# Patient Record
Sex: Male | Born: 1935 | Race: Black or African American | Hispanic: No | Marital: Married | Smoking: Never smoker
Health system: Southern US, Community
[De-identification: ages and names within clinical notes are randomized; demographics above are authoritative.]

## PROBLEM LIST (undated history)

## (undated) DIAGNOSIS — I10 Essential (primary) hypertension: Secondary | ICD-10-CM

## (undated) DIAGNOSIS — E119 Type 2 diabetes mellitus without complications: Secondary | ICD-10-CM

## (undated) DIAGNOSIS — I442 Atrioventricular block, complete: Secondary | ICD-10-CM

## (undated) DIAGNOSIS — N289 Disorder of kidney and ureter, unspecified: Secondary | ICD-10-CM

## (undated) DIAGNOSIS — E7800 Pure hypercholesterolemia, unspecified: Secondary | ICD-10-CM

## (undated) DIAGNOSIS — R413 Other amnesia: Secondary | ICD-10-CM

## (undated) DIAGNOSIS — J45909 Unspecified asthma, uncomplicated: Secondary | ICD-10-CM

## (undated) HISTORY — PX: OTHER SURGICAL HISTORY: SHX169

## (undated) HISTORY — DX: Atrioventricular block, complete: I44.2

---

## 2007-08-05 ENCOUNTER — Ambulatory Visit: Payer: Self-pay | Admitting: Gastroenterology

## 2008-03-08 ENCOUNTER — Emergency Department: Payer: Self-pay | Admitting: Internal Medicine

## 2011-12-17 ENCOUNTER — Inpatient Hospital Stay: Payer: Self-pay | Admitting: Internal Medicine

## 2011-12-21 ENCOUNTER — Emergency Department: Payer: Self-pay | Admitting: Emergency Medicine

## 2011-12-27 ENCOUNTER — Ambulatory Visit: Payer: Self-pay | Admitting: Internal Medicine

## 2012-01-06 ENCOUNTER — Inpatient Hospital Stay: Payer: Self-pay | Admitting: Internal Medicine

## 2014-11-03 ENCOUNTER — Emergency Department: Payer: Self-pay | Admitting: Emergency Medicine

## 2014-11-14 ENCOUNTER — Emergency Department: Payer: Self-pay | Admitting: Emergency Medicine

## 2015-02-24 ENCOUNTER — Other Ambulatory Visit (HOSPITAL_COMMUNITY): Payer: Self-pay

## 2015-04-23 NOTE — H&P (Signed)
PATIENT NAME:  Fernando [name], Fernando [name] MR#:  [id number] DATE OF BIRTH:  [birth year]  DATE OF ADMISSION:  01/06/2012  HISTORY OF PRESENT ILLNESS: Fernando [name] is a 80 year old black gentleman who came in with his wife on the day of admission for his annual wellness exam and follow-up on his chronic medical problems. When his vital signs were taken, it was noted that he had a marked bradycardia. Rhythm strip was obtained which showed a marked bradycardia with a rate of 36. There appears to be complete heart block.   PAST MEDICAL HISTORY:  1. Hypertension.  2. Hyperlipidemia.  3. Reactive airway disease.  4. Nephrolithiasis.  5. Type 2 diabetes.  6. Chronic renal insufficiency.   ALLERGIES: No known drug allergies.   ADMISSION MEDICATIONS: 1. Amlodipine 10 mg daily.  2. Ventolin 2 puffs every four hours p.r.n.  3. Advair inhaler 100/500 one puff twice a day. 4. Glipizide XL 10 mg daily.  5. Simvastatin 40 mg at bedtime.  6. Ecotrin 325 mg daily. 7. Metoprolol 100 mg twice a day. 8. Metformin 1000 mg twice a day.   SOCIAL/FAMILY HISTORY: The patient's family history and social history are basically unchanged since his most recent admission.   REVIEW OF SYSTEMS: Review of systems was essentially unremarkable with the exception of the present illness. His wife however did note that she had been checking his blood sugars several times a day when he complained of not feeling well and on one occasion got a blood sugar of 60. At that time she had held his metformin.   PHYSICAL EXAMINATION:   VITAL SIGNS: Weight 199 pounds, blood pressure 142/76, and pulse 36.   GENERAL: This is an elderly black gentleman who is in no acute distress despite his low heart rate.   SKIN: Normal in color and texture. There is no lymphadenopathy.   HEENT: Examination of head, ears, eyes, nose and throat was felt to be grossly within normal limits.   NECK: The neck was supple.   LUNGS: Clear to auscultation and  percussion.   HEART: Regular bradycardia without murmur or gallop.   ABDOMEN: Soft and nontender. There were no masses or organomegaly. Bowel sounds were normal.   GENITAL/RECTAL: Deferred.   EXTREMITIES: No edema or deformity.   NEUROLOGIC: Examination was physiological.   IMPRESSION: Complete heart block, possibly medication related.   PLAN: The patient is being admitted to the regular medical floor where he will be placed on telemetry. Because of his bradycardia his metoprolol will be suspended. Because of the patient's history of possible hypoglycemia, his metformin will be held. He will be continued on glipizide.       He will be placed on a sliding scale of insulin. If the patient's heart rate does not improve with holding the beta blocker, cardiology consultation will be requested to consider permanent pacemaker placement.  ____________________________ Letta PateJohn B. Danne HarborWalker III, MD [email or web address] D: 01/06/2012 12:01:16 ET T: 01/06/2012 12:20:04 ET JOB#: [id number]  cc: Jonny RuizJohn B. Danne HarborWalker III, MD, <Dictator> Elmo PuttJOHN B WALKER III MD ELECTRONICALLY SIGNED 01/08/2012 14:55

## 2015-04-30 ENCOUNTER — Emergency Department
Admission: EM | Admit: 2015-04-30 | Discharge: 2015-04-30 | Disposition: A | Discharge status: Home / Self Care | Payer: Commercial Managed Care - HMO | Attending: Emergency Medicine | Admitting: Emergency Medicine

## 2015-04-30 ENCOUNTER — Encounter: Payer: Self-pay | Admitting: Emergency Medicine

## 2015-04-30 ENCOUNTER — Other Ambulatory Visit: Payer: Self-pay

## 2015-04-30 ENCOUNTER — Emergency Department: Payer: Commercial Managed Care - HMO

## 2015-04-30 DIAGNOSIS — I129 Hypertensive chronic kidney disease with stage 1 through stage 4 chronic kidney disease, or unspecified chronic kidney disease: Secondary | ICD-10-CM | POA: Insufficient documentation

## 2015-04-30 DIAGNOSIS — E119 Type 2 diabetes mellitus without complications: Secondary | ICD-10-CM | POA: Insufficient documentation

## 2015-04-30 DIAGNOSIS — J069 Acute upper respiratory infection, unspecified: Secondary | ICD-10-CM

## 2015-04-30 DIAGNOSIS — N189 Chronic kidney disease, unspecified: Secondary | ICD-10-CM | POA: Diagnosis not present

## 2015-04-30 DIAGNOSIS — J4521 Mild intermittent asthma with (acute) exacerbation: Secondary | ICD-10-CM | POA: Diagnosis not present

## 2015-04-30 DIAGNOSIS — R062 Wheezing: Secondary | ICD-10-CM | POA: Diagnosis present

## 2015-04-30 HISTORY — DX: Type 2 diabetes mellitus without complications: E11.9

## 2015-04-30 HISTORY — DX: Unspecified asthma, uncomplicated: J45.909

## 2015-04-30 HISTORY — DX: Pure hypercholesterolemia, unspecified: E78.00

## 2015-04-30 HISTORY — DX: Essential (primary) hypertension: I10

## 2015-04-30 LAB — BASIC METABOLIC PANEL
ANION GAP: 10 (ref 5–15)
BUN: 19 mg/dL (ref 6–20)
CHLORIDE: 99 mmol/L — AB (ref 101–111)
CO2: 25 mmol/L (ref 22–32)
Calcium: 9.2 mg/dL (ref 8.9–10.3)
Creatinine, Ser: 1.31 mg/dL — ABNORMAL HIGH (ref 0.61–1.24)
GFR calc non Af Amer: 51 mL/min — ABNORMAL LOW (ref >60)
GFR, EST AFRICAN AMERICAN: 59 mL/min — AB (ref >60)
Glucose, Bld: 169 mg/dL — ABNORMAL HIGH (ref 65–99)
Potassium: 3.6 mmol/L (ref 3.5–5.1)
SODIUM: 134 mmol/L — AB (ref 135–145)

## 2015-04-30 LAB — CBC WITH DIFFERENTIAL/PLATELET
Basophils Absolute: 0 10*3/uL (ref 0–0.1)
Basophils Relative: 1 %
EOS ABS: 0.3 10*3/uL (ref 0–0.7)
HEMATOCRIT: 42.5 % (ref 40.0–52.0)
HEMOGLOBIN: 14.5 g/dL (ref 13.0–18.0)
LYMPHS ABS: 1.3 10*3/uL (ref 1.0–3.6)
Lymphocytes Relative: 19 %
MCH: 29.7 pg (ref 26.0–34.0)
MCHC: 34 g/dL (ref 32.0–36.0)
MCV: 87.4 fL (ref 80.0–100.0)
MONO ABS: 1 10*3/uL (ref 0.2–1.0)
Monocytes Relative: 15 %
Neutro Abs: 4.2 10*3/uL (ref 1.4–6.5)
Neutrophils Relative %: 61 %
Platelets: 175 10*3/uL (ref 150–440)
RBC: 4.87 MIL/uL (ref 4.40–5.90)
RDW: 13.4 % (ref 11.5–14.5)
WBC: 6.8 10*3/uL (ref 3.8–10.6)

## 2015-04-30 LAB — TROPONIN I: Troponin I: 0.03 ng/mL (ref <0.031)

## 2015-04-30 MED ORDER — PREDNISONE 20 MG PO TABS
40.0000 mg | ORAL_TABLET | Freq: Once | ORAL | Status: AC
  Administered 2015-04-30: 40 mg via ORAL

## 2015-04-30 MED ORDER — PREDNISONE 20 MG PO TABS
ORAL_TABLET | ORAL | Status: AC
  Filled 2015-04-30: qty 2

## 2015-04-30 MED ORDER — IPRATROPIUM-ALBUTEROL 0.5-2.5 (3) MG/3ML IN SOLN
RESPIRATORY_TRACT | Status: AC
  Filled 2015-04-30: qty 3

## 2015-04-30 MED ORDER — PREDNISONE 10 MG PO TABS: ORAL_TABLET | ORAL | Status: DC

## 2015-04-30 MED ORDER — IPRATROPIUM-ALBUTEROL 0.5-2.5 (3) MG/3ML IN SOLN
3.0000 mL | Freq: Once | RESPIRATORY_TRACT | Status: AC
  Administered 2015-04-30: 3 mL via RESPIRATORY_TRACT

## 2015-04-30 MED ORDER — KETOROLAC TROMETHAMINE 60 MG/2ML IM SOLN: 60.0000 mg | Freq: Once | INTRAMUSCULAR | Status: DC

## 2015-04-30 NOTE — Discharge Instructions (Signed)

## 2015-04-30 NOTE — ED Notes (Signed)
Patient c/o cough, congestion and wheezing x3-4 days. Worse at night. Hx of asthma. Able to ambulate and speak in complete sentences without any obvious distress

## 2015-04-30 NOTE — ED Provider Notes (Signed)
[facility] Emergency Department Provider Note    ____________________________________________  Time seen:10:00   I have  reviewed the triage vital signs and the nursing notes.   HISTORY  Chief Complaint Cough    HPI Fernando [name] is a 80 y.o. male who presents to the emergency room today with complaint of wheezing last night. He states that he developed a cold for the last 3 or 4 days prior to the event last night. He has been using his albuterol inhaler which has helped. He is also used his inhaler this morning and  states that he is feeling much better at this time.  His daughter is here with him today and is concerned about this as her mother was diagnosed with bronchitis this week.     Past Medical History  Diagnosis Date  . Asthma   . Diabetes mellitus without complication   . Hypertension   . Hypercholesteremia    history of bradycardia, with previous admission to [facility]  There are no active problems to display for this patient.   Past Surgical History  Procedure Laterality Date  . Left arm surgery      No current outpatient prescriptions on file.  Allergies Shellfish allergy  No family history on file.  Social History History  Substance Use Topics  . Smoking status: Never Smoker   . Smokeless tobacco: Not on file  . Alcohol Use: No    Review of Systems  Constitutional: Negative for fever. Eyes: Negative for visual changes. ENT: Positive for sore throat. Cardiovascular: Negative for chest pain.  Respiratory: Mild shortness of breath due to his asthma  Gastrointestinal: Negative for abdominal pain, vomiting and diarrhea. Neurological: Negative for headaches, focal weakness or numbness.  10-point ROS otherwise negative.  ____________________________________________   PHYSICAL EXAM:  VITAL SIGNS: ED Triage Vitals  Enc Vitals Group     BP 04/30/15 0941 188/96 mmHg     Pulse Rate 04/30/15 0941 52     Resp 04/30/15  0941 20     Temp 04/30/15 0941 99.8 F (37.7 C)     Temp Source 04/30/15 0941 Oral     SpO2 04/30/15 0941 95 %     Weight 04/30/15 0941 200 lb (90.719 kg)     Height 04/30/15 0941  (1.753 m)     Head Cir --      Peak Flow --      Pain Score --      Pain Loc --      Pain Edu? --      Excl. in GC? --      Constitutional: Alert and oriented. Well appearing and in no distress. Patient is talking in complete sentences without any distress Eyes: Conjunctivae are normal. PERRL. Normal extraocular movements. ENT   Head: Normocephalic and atraumatic.   Nose: Minimal congestion   Mouth/Throat: Mucous membranes are moist.   Neck: No stridor. Hematological/Lymphatic/Immunilogical: No cervical lymphadenopathy. Cardiovascular: Low normal rate, regular rhythm. Normal and symmetric distal pulses are present in all extremities. No murmurs, rubs, or gallops. Respiratory: Normal respiratory effort without tachypnea nor retractions. Breath sounds are are without wheezing, with fair air exchange bilaterally  Gastrointestinal: Soft and nontender. No distention. No abdominal bruits. There is no CVA tenderness. Musculoskeletal: Nontender with normal range of motion in all extremities. No joint effusions.   Right lower leg:  No tenderness or edema.   Left lower leg:  No tenderness or edema. Neurologic:  Normal speech and language. No  gross focal neurologic deficits are appreciated. Speech is normal. No gait instability. Skin:  Skin is warm, dry and intact. No rash noted. Psychiatric: Mood and affect are normal. Speech and behavior are normal. Patient exhibits appropriate insight and judgment.  ____________________________________________   EKG  ED ECG REPORT   Date: 04/30/2015  EKG Time: 1045  Rate: 64   Rhythm: Accelerated junctional rhythm,   Axis: Normal  Intervals:none  ST&T Change: Nonspecific ST and T-wave changes  Narrative Interpretation: Abnormal  EKG   ____________________________________________    RADIOLOGY No consolidation, evidence of old healed rib fractures  ____________________________________________   PROCEDURES  Procedure(s) performed: None   ____________________________________________   INITIAL IMPRESSION / ASSESSMENT AND PLAN / ED COURSE  Pertinent labs & imaging results that were available during my care of the patient were reviewed by me and considered in my medical decision making (see chart for details).  1430  chest x-ray and lab work was reviewed. Patient was reassessed. Patient states he is breathing much better and feels "great". There is improved air movement with less wheezing. Patient states he did take his blood pressure medicine today.  Blood pressure remained elevated during his stay in the emergency room. We discussed follow-up with his doctor about his blood pressure and to recheck. Previous note from Dr. Dan HumphreysWalker was reviewed. Patient has a history of chronic renal insufficiency. Patient was ambulatory in the emergency room without any difficulty during his visit. He is being discharged on a tapered dose of prednisone. He has albuterol inhaler at home. At the time of his discharge he is denying any shortness of breath, chest pain, he continues to talk in complete sentences. No shortness of breath on exertion.                                                    ____________________________________________   FINAL CLINICAL IMPRESSION(S) / ED DIAGNOSES  Final diagnoses:  Asthma, mild intermittent, with acute exacerbation  Acute URI     Tommi RumpsRhonda L Darrie Macmillan, PA-C 04/30/15 1523  Jene Everyobert Kinner, MD 05/01/15 [phone]

## 2016-01-30 ENCOUNTER — Emergency Department: Payer: Commercial Managed Care - HMO

## 2016-01-30 ENCOUNTER — Encounter: Payer: Self-pay | Admitting: *Deleted

## 2016-01-30 ENCOUNTER — Emergency Department
Admission: EM | Admit: 2016-01-30 | Discharge: 2016-01-30 | Disposition: A | Discharge status: Home / Self Care | Payer: Commercial Managed Care - HMO | Attending: Emergency Medicine | Admitting: Emergency Medicine

## 2016-01-30 DIAGNOSIS — I499 Cardiac arrhythmia, unspecified: Secondary | ICD-10-CM | POA: Diagnosis not present

## 2016-01-30 DIAGNOSIS — I10 Essential (primary) hypertension: Secondary | ICD-10-CM | POA: Insufficient documentation

## 2016-01-30 DIAGNOSIS — R079 Chest pain, unspecified: Secondary | ICD-10-CM | POA: Diagnosis not present

## 2016-01-30 DIAGNOSIS — E119 Type 2 diabetes mellitus without complications: Secondary | ICD-10-CM | POA: Insufficient documentation

## 2016-01-30 LAB — CBC
HCT: 42.5 % (ref 40.0–52.0)
HEMOGLOBIN: 14.6 g/dL (ref 13.0–18.0)
MCH: 28.8 pg (ref 26.0–34.0)
MCHC: 34.4 g/dL (ref 32.0–36.0)
MCV: 83.9 fL (ref 80.0–100.0)
PLATELETS: 220 10*3/uL (ref 150–440)
RBC: 5.07 MIL/uL (ref 4.40–5.90)
RDW: 13.3 % (ref 11.5–14.5)
WBC: 6.8 10*3/uL (ref 3.8–10.6)

## 2016-01-30 LAB — BASIC METABOLIC PANEL
Anion gap: 9 (ref 5–15)
BUN: 20 mg/dL (ref 6–20)
CALCIUM: 9.7 mg/dL (ref 8.9–10.3)
CO2: 28 mmol/L (ref 22–32)
CREATININE: 1.4 mg/dL — AB (ref 0.61–1.24)
Chloride: 101 mmol/L (ref 101–111)
GFR, EST AFRICAN AMERICAN: 54 mL/min — AB (ref >60)
GFR, EST NON AFRICAN AMERICAN: 47 mL/min — AB (ref >60)
Glucose, Bld: 197 mg/dL — ABNORMAL HIGH (ref 65–99)
Potassium: 4.1 mmol/L (ref 3.5–5.1)
Sodium: 138 mmol/L (ref 135–145)

## 2016-01-30 LAB — FIBRIN DERIVATIVES D-DIMER (ARMC ONLY): FIBRIN DERIVATIVES D-DIMER (ARMC): 757 — AB (ref 0–499)

## 2016-01-30 LAB — TROPONIN I
TROPONIN I: 0.05 ng/mL — AB (ref <0.031)
Troponin I: 0.03 ng/mL (ref <0.031)

## 2016-01-30 LAB — MAGNESIUM: Magnesium: 1.8 mg/dL (ref 1.7–2.4)

## 2016-01-30 MED ORDER — IOHEXOL 350 MG/ML SOLN
75.0000 mL | Freq: Once | INTRAVENOUS | Status: AC | PRN
  Administered 2016-01-30: 75 mL via INTRAVENOUS

## 2016-01-30 NOTE — ED Notes (Signed)
Pt states right sided chest pain that began this AM, states he feels better when he takes a deep breathe, awake and alert in no acute distress, states hx of asthma

## 2016-01-30 NOTE — ED Notes (Signed)
Patient transported to CT 

## 2016-01-30 NOTE — ED Provider Notes (Signed)
81 year old male with hypertension and diabetes who is presenting today with pleuritic chest pain that started at 10:30 AM. He said that the pain was only there when he took a deep breath. It was nonradiating and on the right side of his chest. He denied any shortness of breath with this. Denied any nausea or vomiting. Denied any diaphoresis. He was found to have any arrhythmia on his EKG which was looked at by Dr. Welton Flakes of cardiology who said the patient would be appropriate for outpatient treatment as long as his labs were reassuring. Physical Exam  BP 182/111 mmHg  Pulse 53  Temp(Src) 98.4 F (36.9 C) (Oral)  Resp 19  Ht  (1.753 m)  Wt 190 lb (86.183 kg)  BMI 28.05 kg/m2  SpO2 99%  Physical Exam Patient without any distress and without any chest pain at my initial exam. ED Course  Procedures  MDM ----------------------------------------- 5:50 PM on 01/30/2016 -----------------------------------------  Patient remains without any chest pain at this time. Slightly elevated troponin with a positive d-dimer without any acute finding on his CAT scan of the chest. Discussed the case with Dr. Juliann Pares at the family's request to follow-up with the [facility] clinic. Dr. Juliann Pares says the patient may see him tomorrow as long as he calls 8:30 in the morning to schedule follow-up appointment.  ----------------------------------------- 6:50 PM on 01/30/2016 -----------------------------------------  Patient continues to rest comfortably without any chest pain. He says "I feel great." His second troponin is within normal limits. I discussed with the family discharged home and follow up with Dr. Juliann Pares in the morning and he understands. His family is at the bedside also understands that they must call at 8:30 AM tomorrow morning for same-day follow-up appointment.      Myrna Blazer, MD 01/30/16 [phone]

## 2016-01-30 NOTE — ED Provider Notes (Signed)
[facility] Emergency Department Provider Note     Time seen: ----------------------------------------- 2:32 PM on 01/30/2016 -----------------------------------------    I have reviewed the triage vital signs and the nursing notes.   HISTORY  Chief Complaint Chest Pain    HPI Fernando [name] is a 81 y.o. male who presents ER with right-sided chest pain that began this morning. Patient states he feels better now when he takes a deep breath sometimes still bothersome. Does have history of asthma, has not had a history of this before. Denies any recent illness or changes in his medications. He denies any fevers chills or persistent chest pain at this time.   Past Medical History  Diagnosis Date  . Asthma   . Diabetes mellitus without complication (HCC)   . Hypertension   . Hypercholesteremia     There are no active problems to display for this patient.   Past Surgical History  Procedure Laterality Date  . Left arm surgery      Allergies Shellfish allergy  Social History Social History  Substance Use Topics  . Smoking status: Never Smoker   . Smokeless tobacco: None  . Alcohol Use: No    Review of Systems Constitutional: Negative for fever. Eyes: Negative for visual changes. ENT: Negative for sore throat. Cardiovascular: Positive for chest pain, worse with breathing Respiratory: Negative for shortness of breath. Gastrointestinal: Negative for abdominal pain, vomiting and diarrhea. Genitourinary: Negative for dysuria. Musculoskeletal: Negative for back pain. Skin: Negative for rash. Neurological: Negative for headaches, focal weakness or numbness.  10-point ROS otherwise negative.  ____________________________________________   PHYSICAL EXAM:  VITAL SIGNS: ED Triage Vitals  Enc Vitals Group     BP 01/30/16 1354 181/62 mmHg     Pulse Rate 01/30/16 1354 62     Resp 01/30/16 1354 18     Temp 01/30/16 1354 98.4 F (36.9 C)      Temp Source 01/30/16 1354 Oral     SpO2 01/30/16 1354 99 %     Weight 01/30/16 1354 190 lb (86.183 kg)     Height 01/30/16 1354  (1.753 m)     Head Cir --      Peak Flow --      Pain Score 01/30/16 1353 3     Pain Loc --      Pain Edu? --      Excl. in GC? --     Constitutional: Alert and oriented. Well appearing and in no distress. Eyes: Conjunctivae are normal. PERRL. Normal extraocular movements. ENT   Head: Normocephalic and atraumatic.   Nose: No congestion/rhinnorhea.   Mouth/Throat: Mucous membranes are moist.   Neck: No stridor. Cardiovascular: Normal rate, regular rhythm. Normal and symmetric distal pulses are present in all extremities. No murmurs, rubs, or gallops. Respiratory: Normal respiratory effort without tachypnea nor retractions. Breath sounds are clear and equal bilaterally. No wheezes/rales/rhonchi. Gastrointestinal: Soft and nontender. No distention. No abdominal bruits.  Musculoskeletal: Nontender with normal range of motion in all extremities. No joint effusions.  No lower extremity tenderness nor edema. Neurologic:  Normal speech and language. No gross focal neurologic deficits are appreciated. Speech is normal.  Skin:  Skin is warm, dry and intact. No rash noted. Psychiatric: Mood and affect are normal. Speech and behavior are normal. Patient exhibits appropriate insight and judgment. ____________________________________________  EKG: Interpreted by me. Junctional rhythm with a rate of 64, normal QRS, normal QT interval. Normal axis.  ____________________________________________  ED COURSE:  Pertinent labs &  imaging results that were available during my care of the patient were reviewed by me and considered in my medical decision making (see chart for details). Patient with an abnormal EKG, benign exam. Will check Cardec labs and consult cardiologist to evaluate his EKG. ____________________________________________    LABS  (pertinent positives/negatives)  Labs Reviewed  CBC  BASIC METABOLIC PANEL  TROPONIN I  FIBRIN DERIVATIVES D-DIMER ([facility] ONLY)  MAGNESIUM    RADIOLOGY Images were viewed by me  Chest x-ray Is pending at this time ____________________________________________  FINAL ASSESSMENT AND PLAN  Chest pain  Plan: Patient with labs and imaging as dictated above. I have sent a copy of his EKG to Dr. Park Breed. Patient states of his labs and electrolytes are okay he can see him tomorrow in the office at 10:30 AM.   Emily Filbert, MD   Emily Filbert, MD 01/30/16 [phone]

## 2016-01-30 NOTE — Discharge Instructions (Signed)
Nonspecific Chest Pain  °Chest pain can be caused by many different conditions. There is always a chance that your pain could be related to something serious, such as a heart attack or a blood clot in your lungs. Chest pain can also be caused by conditions that are not life-threatening. If you have chest pain, it is very important to follow up with your health care provider. °CAUSES  °Chest pain can be caused by: °· Heartburn. °· Pneumonia or bronchitis. °· Anxiety or stress. °· Inflammation around your heart (pericarditis) or lung (pleuritis or pleurisy). °· A blood clot in your lung. °· A collapsed lung (pneumothorax). It can develop suddenly on its own (spontaneous pneumothorax) or from trauma to the chest. °· Shingles infection (varicella-zoster virus). °· Heart attack. °· Damage to the bones, muscles, and cartilage that make up your chest wall. This can include: °¨ Bruised bones due to injury. °¨ Strained muscles or cartilage due to frequent or repeated coughing or overwork. °¨ Fracture to one or more ribs. °¨ Sore cartilage due to inflammation (costochondritis). °RISK FACTORS  °Risk factors for chest pain may include: °· Activities that increase your risk for trauma or injury to your chest. °· Respiratory infections or conditions that cause frequent coughing. °· Medical conditions or overeating that can cause heartburn. °· Heart disease or family history of heart disease. °· Conditions or health behaviors that increase your risk of developing a blood clot. °· Having had chicken pox (varicella zoster). °SIGNS AND SYMPTOMS °Chest pain can feel like: °· Burning or tingling on the surface of your chest or deep in your chest. °· Crushing, pressure, aching, or squeezing pain. °· Dull or sharp pain that is worse when you move, cough, or take a deep breath. °· Pain that is also felt in your back, neck, shoulder, or arm, or pain that spreads to any of these areas. °Your chest pain may come and go, or it may stay  constant. °DIAGNOSIS °Lab tests or other studies may be needed to find the cause of your pain. Your health care provider may have you take a test called an ambulatory ECG (electrocardiogram). An ECG records your heartbeat patterns at the time the test is performed. You may also have other tests, such as: °· Transthoracic echocardiogram (TTE). During echocardiography, sound waves are used to create a picture of all of the heart structures and to look at how blood flows through your heart. °· Transesophageal echocardiogram (TEE). This is a more advanced imaging test that obtains images from inside your body. It allows your health care provider to see your heart in finer detail. °· Cardiac monitoring. This allows your health care provider to monitor your heart rate and rhythm in real time. °· Holter monitor. This is a portable device that records your heartbeat and can help to diagnose abnormal heartbeats. It allows your health care provider to track your heart activity for several days, if needed. °· Stress tests. These can be done through exercise or by taking medicine that makes your heart beat more quickly. °· Blood tests. °· Imaging tests. °TREATMENT  °Your treatment depends on what is causing your chest pain. Treatment may include: °· Medicines. These may include: °¨ Acid blockers for heartburn. °¨ Anti-inflammatory medicine. °¨ Pain medicine for inflammatory conditions. °¨ Antibiotic medicine, if an infection is present. °¨ Medicines to dissolve blood clots. °¨ Medicines to treat coronary artery disease. °· Supportive care for conditions that do not require medicines. This may include: °¨ Resting. °¨ Applying heat   or cold packs to injured areas. °¨ Limiting activities until pain decreases. °HOME CARE INSTRUCTIONS °· If you were prescribed an antibiotic medicine, finish it all even if you start to feel better. °· Avoid any activities that bring on chest pain. °· Do not use any tobacco products, including  cigarettes, chewing tobacco, or electronic cigarettes. If you need help quitting, ask your health care provider. °· Do not drink alcohol. °· Take medicines only as directed by your health care provider. °· Keep all follow-up visits as directed by your health care provider. This is important. This includes any further testing if your chest pain does not go away. °· If heartburn is the cause for your chest pain, you may be told to keep your head raised (elevated) while sleeping. This reduces the chance that acid will go from your stomach into your esophagus. °· Make lifestyle changes as directed by your health care provider. These may include: °¨ Getting regular exercise. Ask your health care provider to suggest some activities that are safe for you. °¨ Eating a heart-healthy diet. A registered dietitian can help you to learn healthy eating options. °¨ Maintaining a healthy weight. °¨ Managing diabetes, if necessary. °¨ Reducing stress. °SEEK MEDICAL CARE IF: °· Your chest pain does not go away after treatment. °· You have a rash with blisters on your chest. °· You have a fever. °SEEK IMMEDIATE MEDICAL CARE IF:  °· Your chest pain is worse. °· You have an increasing cough, or you cough up blood. °· You have severe abdominal pain. °· You have severe weakness. °· You faint. °· You have chills. °· You have sudden, unexplained chest discomfort. °· You have sudden, unexplained discomfort in your arms, back, neck, or jaw. °· You have shortness of breath at any time. °· You suddenly start to sweat, or your skin gets clammy. °· You feel nauseous or you vomit. °· You suddenly feel light-headed or dizzy. °· Your heart begins to beat quickly, or it feels like it is skipping beats. °These symptoms may represent a serious problem that is an emergency. Do not wait to see if the symptoms will go away. Get medical help right away. Call your local emergency services (911 in the [place] Do not drive yourself to the hospital. °  °This  information is not intended to replace advice given to you by your health care provider. Make sure you discuss any questions you have with your health care provider. °  °Document Released: 09/25/2005 Document Revised: 01/06/2015 Document Reviewed: 07/22/2014 °Elsevier Interactive Patient Education ©2016 [employer] ° °

## 2016-06-04 ENCOUNTER — Telehealth: Payer: Self-pay | Admitting: Cardiovascular Disease

## 2016-06-04 NOTE — Telephone Encounter (Signed)
Lmov for patient to call back. Received a referral fax from Dr Odessa FlemingKlein's office. Notes on file

## 2016-06-06 ENCOUNTER — Encounter: Payer: Self-pay | Admitting: Cardiology

## 2016-06-06 ENCOUNTER — Encounter (INDEPENDENT_AMBULATORY_CARE_PROVIDER_SITE_OTHER): Payer: Commercial Managed Care - HMO | Admitting: Cardiology

## 2016-06-06 VITALS — BP 156/62 | HR 48 | Ht 69.0 in | Wt 199.4 lb

## 2016-06-07 NOTE — Progress Notes (Signed)
This encounter was created in error - please disregard.

## 2016-06-28 ENCOUNTER — Encounter: Payer: Self-pay | Admitting: Internal Medicine

## 2016-06-28 ENCOUNTER — Ambulatory Visit (INDEPENDENT_AMBULATORY_CARE_PROVIDER_SITE_OTHER): Payer: Commercial Managed Care - HMO | Admitting: Internal Medicine

## 2016-06-28 VITALS — BP 150/60 | HR 40 | Ht 69.0 in | Wt 200.8 lb

## 2016-06-28 DIAGNOSIS — I442 Atrioventricular block, complete: Secondary | ICD-10-CM | POA: Diagnosis not present

## 2016-06-28 DIAGNOSIS — I441 Atrioventricular block, second degree: Secondary | ICD-10-CM

## 2016-06-28 HISTORY — DX: Atrioventricular block, complete: I44.2

## 2016-06-28 NOTE — Progress Notes (Signed)
ELECTROPHYSIOLOGY CONSULT NOTE  Patient ID: Fernando [name], MRN: [id number], DOB/AGE: 1935/09/11 81 y.o. Admit date: (Not on file) Date of Consult: 06/28/2016  Primary Physician: Fernando BihariWALKER [name], Fernando B, MD [facility]  Chief Complaint: bradycardia   HPI Fernando [name] is a 81 y.o. male  Referred for consideration of a pacer. He has been noted to have bradycardia.  He denies symptoms of lightheadedness or syncope. He does have some shortness of breath. He puts out a lawn chair when he mows the yard.   He does not have peripheral edema or nocturnal dyspnea or orthopnea.    2/17 Myoview scanning demonstrated ejection fraction 56 with no evidence of ischemia. He exercised for 3 minutes and 51 seconds. 2/17 echocardiogram demonstrated normal LV function normal LA size I'll RV mild RAE enlargement  Past Medical History  Diagnosis Date  . Asthma   . Diabetes mellitus without complication (HCC)   . Hypertension   . Hypercholesteremia       Surgical History:  Past Surgical History  Procedure Laterality Date  . Left arm surgery       Home Meds: Prior to Admission medications   Medication Sig Start Date End Date Taking? Authorizing Provider  albuterol (PROVENTIL HFA;VENTOLIN HFA) 108 (90 Base) MCG/ACT inhaler Inhale 2 puffs into the lungs every 6 (six) hours as needed for wheezing or shortness of breath.    Historical Provider, MD  amLODipine (NORVASC) 10 MG tablet Take 10 mg by mouth 2 (two) times daily.    Historical Provider, MD  aspirin EC 81 MG tablet Take 81 mg by mouth daily.    Historical Provider, MD  carvedilol (COREG) 6.25 MG tablet Take 6.25 mg by mouth 2 (two) times daily with a meal.    Historical Provider, MD  chlorthalidone (HYGROTON) 25 MG tablet Take 25 mg by mouth daily.    Historical Provider, MD  Fluticasone-Salmeterol (ADVAIR) 250-50 MCG/DOSE AEPB Inhale 1 puff into the lungs 2 (two) times daily.     Historical Provider, MD  glipiZIDE (GLUCOTROL XL) 10 MG 24 hr tablet Take 10 mg by mouth daily with breakfast.    Historical Provider, MD  montelukast (SINGULAIR) 10 MG tablet Take 10 mg by mouth at bedtime.    Historical Provider, MD  simvastatin (ZOCOR) 40 MG tablet Take 40 mg by mouth at bedtime.    Historical Provider, MD    Allergies:  Allergies  Allergen Reactions  . Shellfish Allergy Anaphylaxis    Social History   Social History  . Marital Status: Married    Spouse Name: N/A  . Number of Children: N/A  . Years of Education: N/A   Occupational History  . Not on file.   Social History Main Topics  . Smoking status: Never Smoker   . Smokeless tobacco: Not on file  . Alcohol Use: No  . Drug Use: No  . Sexual Activity: Not on file   Other Topics Concern  . Not on file   Social History Narrative     History reviewed. No pertinent family history.   ROS:  Please see the history of present illness.    All other systems reviewed and negative.    Physical Exam: Blood pressure 150/60, pulse 40, height 5\' 9"  (1.753 m), weight 200 lb 12.8 oz (91.082 kg), SpO2 98 %. General: Well developed, well nourished male in no acute distress. Head: Normocephalic, atraumatic, sclera non-icteric, no xanthomas, nares are without discharge. EENT:  normal  Lymph Nodes:  none Neck: Negative for carotid bruits. JVD not elevated. Back:without scoliosis kyphosis Lungs: Clear bilaterally to auscultation without wheezes, rales, or rhonchi. Breathing is unlabored. Heart: RRR with S1 S2.  no murmur . No rubs, or gallops appreciated. Abdomen: Soft, non-tender, non-distended with normoactive bowel sounds. No hepatomegaly. No rebound/guarding. No obvious abdominal masses. Msk:  Strength and tone appear normal for age. Extremities: No clubbing or cyanosis. No edema.  Distal pedal pulses are 2+ and equal bilaterally. Skin: Warm and Dry Neuro: Alert and oriented X 3. CN [name]-XII intact Grossly normal  sensory and motor function . Psych:  Responds to questions appropriately with a normal affect.      Labs: Cardiac Enzymes No results for input(s): CKTOTAL, CKMB, TROPONINI in the last 72 hours. CBC Lab Results  Component Value Date   WBC 6.8 01/30/2016   HGB 14.6 01/30/2016   HCT 42.5 01/30/2016   MCV 83.9 01/30/2016   PLT 220 01/30/2016   PROTIME: No results for input(s): LABPROT, INR in the last 72 hours. Chemistry No results for input(s): NA, K, CL, CO2, BUN, CREATININE, CALCIUM, PROT, BILITOT, ALKPHOS, ALT, AST, GLUCOSE in the last 168 hours.  Invalid input(s): LABALBU Lipids No results found for: CHOL, HDL, LDLCALC, TRIG BNP No results found for: PROBNP Thyroid Function Tests: No results for input(s): TSH, T4TOTAL, T3FREE, THYROIDAB in the last 72 hours.  Invalid input(s): FREET3 Miscellaneous No results found for: DDIMER  Radiology/Studies:  No results found.  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 Intermittent complete with narrow QRS heart rates less than 40  Dyspnea on exertion    Is not clear whether his symptoms are related to his rhythm or not. I was impressed that his treadmill reported only  3.51 minutes suggesting any some degree of exercise intolerance.  He would be a 2-A  indication for pacing for complete heart block even the absence of symptoms; he is disinclined for pacing. However, if his symptoms are attributable to his heart block that it would be a class I indication  We will proceed with treadmill testing    Sherryl MangesSteven Klein

## 2016-06-28 NOTE — Patient Instructions (Signed)
Medication Instructions: - Your physician recommends that you continue on your current medications as directed. Please refer to the Current Medication list given to you today.  Labwork: - none  Procedures/Testing: - Your physician has requested that you have an exercise tolerance test. Thursday 07/04/16 @ 9:15 am You may eat/ drink/ take medications prior Wear comfortable clothes/ shoes No colognes/ lotions that day  Follow-Up: - pending results of your treadmill test  Any Additional Special Instructions Will Be Listed Below (If Applicable).     If you need a refill on your cardiac medications before your next appointment, please call your pharmacy.

## 2016-07-04 ENCOUNTER — Ambulatory Visit (INDEPENDENT_AMBULATORY_CARE_PROVIDER_SITE_OTHER): Payer: Commercial Managed Care - HMO

## 2016-07-04 ENCOUNTER — Encounter: Payer: Self-pay | Admitting: Internal Medicine

## 2016-07-04 ENCOUNTER — Encounter (INDEPENDENT_AMBULATORY_CARE_PROVIDER_SITE_OTHER): Payer: Self-pay

## 2016-07-04 DIAGNOSIS — I441 Atrioventricular block, second degree: Secondary | ICD-10-CM

## 2016-07-04 DIAGNOSIS — I442 Atrioventricular block, complete: Secondary | ICD-10-CM | POA: Diagnosis not present

## 2016-09-06 LAB — EXERCISE TOLERANCE TEST
CHL CUP MPHR: 142 {beats}/min
CHL CUP RESTING HR STRESS: 60 {beats}/min
CSEPEDS: 4 s
CSEPHR: 91 %
Estimated workload: 7 METS
Exercise duration (min): 5 min
Peak HR: 130 {beats}/min

## 2017-01-23 ENCOUNTER — Ambulatory Visit (INDEPENDENT_AMBULATORY_CARE_PROVIDER_SITE_OTHER): Payer: Medicare HMO | Admitting: Internal Medicine

## 2017-01-23 ENCOUNTER — Encounter: Payer: Self-pay | Admitting: *Deleted

## 2017-01-23 ENCOUNTER — Encounter: Payer: Self-pay | Admitting: Internal Medicine

## 2017-01-23 VITALS — BP 170/68 | HR 55 | Ht 69.0 in | Wt 192.0 lb

## 2017-01-23 DIAGNOSIS — J4520 Mild intermittent asthma, uncomplicated: Secondary | ICD-10-CM | POA: Diagnosis not present

## 2017-01-23 DIAGNOSIS — G479 Sleep disorder, unspecified: Secondary | ICD-10-CM | POA: Diagnosis not present

## 2017-01-23 DIAGNOSIS — R0602 Shortness of breath: Secondary | ICD-10-CM | POA: Diagnosis not present

## 2017-01-23 DIAGNOSIS — Z01812 Encounter for preprocedural laboratory examination: Secondary | ICD-10-CM

## 2017-01-23 DIAGNOSIS — I442 Atrioventricular block, complete: Secondary | ICD-10-CM | POA: Diagnosis not present

## 2017-01-23 NOTE — Progress Notes (Signed)
Patient Care Team: Rafael Bihari, MD as PCP - General (Internal Medicine)   HPI  Fernando [name] is a 82 y.o. male Seen in follow-up for dyspnea associated with complete heart block and a narrow QRS escape. Treadmill testing demonstrated chronotropic incompetence with a maximal ventricular rate of about 78 bpm with 2-1 heart block. Treadmill was personally reviewed.  Records and Results Reviewed As above  DATE TEST    2/17    Myoview   EF 56` % No ischemia exercised only 3'51" min--Max HR 115  2/17    echo   EF 55-60 % Normal LA and mild RVE and RAE        He continues with significant dyspnea on exertion at less than one flight of stairs and carrying groceries. It is relieved by rest. He does not have edema or chest discomfort.  He remains with poorly controlled hypertension  Past Medical History:  Diagnosis Date  . Asthma   . Complete heart block (HCC) intermittent narrow QRS escape  06/28/2016  . Diabetes mellitus without complication (HCC)   . Hypercholesteremia   . Hypertension   . Mobitz type 2 second degree heart block  2:1 heart block 06/28/2016    Past Surgical History:  Procedure Laterality Date  . Left Arm Surgery      Current Outpatient Prescriptions  Medication Sig Dispense Refill  . albuterol (PROVENTIL HFA;VENTOLIN HFA) 108 (90 Base) MCG/ACT inhaler Inhale 2 puffs into the lungs every 6 (six) hours as needed for wheezing or shortness of breath.    [name] amLODipine (NORVASC) 10 MG tablet Take 10 mg by mouth 2 (two) times daily.    [name] aspirin EC 81 MG tablet Take 81 mg by mouth daily.    . benazepril (LOTENSIN) 10 MG tablet Take 10 mg by mouth daily.    . carvedilol (COREG) 6.25 MG tablet Take 6.25 mg by mouth 2 (two) times daily with a meal.    . chlorthalidone (HYGROTON) 25 MG tablet Take 25 mg by mouth daily.    . Fluticasone-Salmeterol (ADVAIR) 250-50 MCG/DOSE AEPB Inhale 1 puff into the lungs 2 (two) times daily.    [name] glipiZIDE (GLUCOTROL XL) 10  MG 24 hr tablet Take 10 mg by mouth daily with breakfast.    . montelukast (SINGULAIR) 10 MG tablet Take 10 mg by mouth at bedtime.    . simvastatin (ZOCOR) 40 MG tablet Take 40 mg by mouth at bedtime.     No current facility-administered medications for this visit.     Allergies  Allergen Reactions  . Shellfish Allergy Anaphylaxis      Review of Systems negative except from HPI and PMH  Physical Exam BP (!) 170/68 (BP Location: Right Arm, Patient Position: Sitting, Cuff Size: Normal)   Pulse (!) 55   Ht 5\' 9"  (1.753 m)   Wt 192 lb (87.1 kg)   BMI 28.35 kg/m  Well developed and well nourished in no acute distress HENT normal E scleral and icterus clear Neck Supple JVP flat; carotids brisk and full Clear to ausculation  Regular rate and rhythm, no murmurs gallops or rub Soft with active bowel sounds No clubbing cyanosis  Edema Alert and oriented, grossly normal motor and sensory function Skin Warm and Dry  ECG demonstrates complete heart block with a narrow QRS escape sinus rate is about 10 5 bpm ventricular rate 55  Assessment and  Plan  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 Intermittent complete with narrow  QRS heart rates less than 40  Dyspnea on exertion  Hypertension  Diabetes   The patient has persistent dyspnea on exertion with documented complete heart block chronotropic incompetence. It is unlikely related to the beta blocker; we will discontinue it. He will require pacing and we will anticipate His bundle ventricular pacing lead location   Blood pressures poorly controlled. This may be aggravated by his heart block as suggested by the sinus tachycardia underlying. For right now we will not add further medications. In the context of his diabetes he is also an ACE inhibitor which is appropriate  The benefits and risks were reviewed including but not limited to death,  perforation, infection, lead dislodgement and device malfunction.  The patient understands agrees  and is willing to proceed.      Current medicines are reviewed at length with the patient today .  The patient does not  have concerns regarding medicines. More than 50% of 45 min was spent in counseling related to the above

## 2017-01-23 NOTE — Patient Instructions (Addendum)
Medication Instructions: - Your physician has recommended you make the following change in your medication:  1) Stop coreg (carvedilol)  Labwork: - Your physician recommends that you have lab work today: BMP/CBC/INR  Procedures/Testing: - Your physician has recommended that you have a pacemaker inserted. A pacemaker is a small device that is placed under the skin of your chest or abdomen to help control abnormal heart rhythms. This device uses electrical pulses to prompt the heart to beat at a normal rate. Pacemakers are used to treat heart rhythms that are too slow. Wire (leads) are attached to the pacemaker that goes into the chambers of you heart. This is done in the hospital and usually requires and overnight stay. Please see the instruction sheet given to you today for more information.  Follow-Up: - Your physician recommends that you schedule a follow-up appointment in: about 14 days (from 2/7) with the [facility] for a wound check   Any Additional Special Instructions Will Be Listed Below (If Applicable).     If you need a refill on your cardiac medications before your next appointment, please call your pharmacy.

## 2017-01-24 LAB — BASIC METABOLIC PANEL
BUN/Creatinine Ratio: 16 (ref 10–24)
BUN: 20 mg/dL (ref 8–27)
CALCIUM: 9.6 mg/dL (ref 8.6–10.2)
CO2: 24 mmol/L (ref 18–29)
Chloride: 98 mmol/L (ref 96–106)
Creatinine, Ser: 1.26 mg/dL (ref 0.76–1.27)
GFR, EST AFRICAN AMERICAN: 62 mL/min/{1.73_m2} (ref >59)
GFR, EST NON AFRICAN AMERICAN: 54 mL/min/{1.73_m2} — AB (ref >59)
Glucose: 171 mg/dL — ABNORMAL HIGH (ref 65–99)
Potassium: 4.2 mmol/L (ref 3.5–5.2)
Sodium: 138 mmol/L (ref 134–144)

## 2017-01-24 LAB — CBC WITH DIFFERENTIAL/PLATELET
BASOS: 0 %
Basophils Absolute: 0 10*3/uL (ref 0.0–0.2)
EOS (ABSOLUTE): 0.1 10*3/uL (ref 0.0–0.4)
EOS: 2 %
HEMOGLOBIN: 14 g/dL (ref 13.0–17.7)
Hematocrit: 40 % (ref 37.5–51.0)
IMMATURE GRANS (ABS): 0 10*3/uL (ref 0.0–0.1)
Immature Granulocytes: 0 %
LYMPHS: 31 %
Lymphocytes Absolute: 2 10*3/uL (ref 0.7–3.1)
MCH: 30.1 pg (ref 26.6–33.0)
MCHC: 35 g/dL (ref 31.5–35.7)
MCV: 86 fL (ref 79–97)
MONOCYTES: 9 %
Monocytes Absolute: 0.6 10*3/uL (ref 0.1–0.9)
NEUTROS ABS: 3.7 10*3/uL (ref 1.4–7.0)
Neutrophils: 58 %
Platelets: 215 10*3/uL (ref 150–379)
RBC: 4.65 x10E6/uL (ref 4.14–5.80)
RDW: 14 % (ref 12.3–15.4)
WBC: 6.4 10*3/uL (ref 3.4–10.8)

## 2017-01-24 LAB — PROTIME-INR
INR: 1 (ref 0.8–1.2)
Prothrombin Time: 10.9 s (ref 9.1–12.0)

## 2017-01-31 NOTE — Addendum Note (Signed)
Addended by: Kendrick FriesLOPEZ, Surah Pelley C on: 01/31/2017 02:01 PM   Modules accepted: Orders

## 2017-02-05 ENCOUNTER — Encounter (HOSPITAL_COMMUNITY)
Admission: RE | Disposition: A | Discharge status: Home / Self Care | Payer: Self-pay | Source: Ambulatory Visit | Attending: Internal Medicine

## 2017-02-05 ENCOUNTER — Ambulatory Visit (HOSPITAL_COMMUNITY)
Admission: RE | Admit: 2017-02-05 | Discharge: 2017-02-06 | Disposition: A | Discharge status: Home / Self Care | Payer: Medicare HMO | Source: Ambulatory Visit | Attending: Internal Medicine | Admitting: Internal Medicine

## 2017-02-05 DIAGNOSIS — Z7982 Long term (current) use of aspirin: Secondary | ICD-10-CM | POA: Diagnosis not present

## 2017-02-05 DIAGNOSIS — Z91013 Allergy to seafood: Secondary | ICD-10-CM | POA: Diagnosis not present

## 2017-02-05 DIAGNOSIS — I10 Essential (primary) hypertension: Secondary | ICD-10-CM | POA: Insufficient documentation

## 2017-02-05 DIAGNOSIS — J45909 Unspecified asthma, uncomplicated: Secondary | ICD-10-CM | POA: Diagnosis not present

## 2017-02-05 DIAGNOSIS — Z7984 Long term (current) use of oral hypoglycemic drugs: Secondary | ICD-10-CM | POA: Insufficient documentation

## 2017-02-05 DIAGNOSIS — Z959 Presence of cardiac and vascular implant and graft, unspecified: Secondary | ICD-10-CM

## 2017-02-05 DIAGNOSIS — I442 Atrioventricular block, complete: Secondary | ICD-10-CM | POA: Diagnosis not present

## 2017-02-05 DIAGNOSIS — E7800 Pure hypercholesterolemia, unspecified: Secondary | ICD-10-CM | POA: Insufficient documentation

## 2017-02-05 DIAGNOSIS — E119 Type 2 diabetes mellitus without complications: Secondary | ICD-10-CM | POA: Diagnosis not present

## 2017-02-05 DIAGNOSIS — I4589 Other specified conduction disorders: Secondary | ICD-10-CM | POA: Diagnosis not present

## 2017-02-05 HISTORY — PX: PACEMAKER IMPLANT: EP1218

## 2017-02-05 LAB — MRSA PCR SCREENING: MRSA BY PCR: NEGATIVE

## 2017-02-05 LAB — GLUCOSE, CAPILLARY: GLUCOSE-CAPILLARY: 155 mg/dL — AB (ref 65–99)

## 2017-02-05 SURGERY — PACEMAKER IMPLANT

## 2017-02-05 MED ORDER — MONTELUKAST SODIUM 10 MG PO TABS
10.0000 mg | ORAL_TABLET | Freq: Every day | ORAL | Status: DC
  Administered 2017-02-05: 10 mg via ORAL
  Filled 2017-02-05: qty 1

## 2017-02-05 MED ORDER — FENTANYL CITRATE (PF) 100 MCG/2ML IJ SOLN
INTRAMUSCULAR | Status: AC
  Filled 2017-02-05: qty 2

## 2017-02-05 MED ORDER — SIMVASTATIN 40 MG PO TABS
40.0000 mg | ORAL_TABLET | Freq: Every day | ORAL | Status: DC
  Administered 2017-02-05: 40 mg via ORAL
  Filled 2017-02-05: qty 1

## 2017-02-05 MED ORDER — MIDAZOLAM HCL 5 MG/5ML IJ SOLN
INTRAMUSCULAR | Status: DC | PRN
  Administered 2017-02-05 (×3): 1 mg via INTRAVENOUS

## 2017-02-05 MED ORDER — SODIUM CHLORIDE 0.9 % IR SOLN
Status: AC
  Filled 2017-02-05: qty 2

## 2017-02-05 MED ORDER — CARVEDILOL 6.25 MG PO TABS
6.2500 mg | ORAL_TABLET | Freq: Two times a day (BID) | ORAL | Status: DC
  Administered 2017-02-05 – 2017-02-06 (×2): 6.25 mg via ORAL
  Filled 2017-02-05 (×2): qty 1

## 2017-02-05 MED ORDER — AMLODIPINE BESYLATE 10 MG PO TABS
10.0000 mg | ORAL_TABLET | Freq: Two times a day (BID) | ORAL | Status: DC
  Administered 2017-02-05 – 2017-02-06 (×2): 10 mg via ORAL
  Filled 2017-02-05 (×2): qty 1

## 2017-02-05 MED ORDER — CEFAZOLIN SODIUM-DEXTROSE 2-4 GM/100ML-% IV SOLN
INTRAVENOUS | Status: AC
  Filled 2017-02-05: qty 100

## 2017-02-05 MED ORDER — SODIUM CHLORIDE 0.9 % IV SOLN: INTRAVENOUS | Status: AC

## 2017-02-05 MED ORDER — MUPIROCIN 2 % EX OINT
1.0000 "application " | TOPICAL_OINTMENT | Freq: Once | CUTANEOUS | Status: AC
  Administered 2017-02-05: 1 via TOPICAL

## 2017-02-05 MED ORDER — ONDANSETRON HCL 4 MG/2ML IJ SOLN: 4.0000 mg | Freq: Four times a day (QID) | INTRAMUSCULAR | Status: DC | PRN

## 2017-02-05 MED ORDER — HEPARIN (PORCINE) IN NACL 2-0.9 UNIT/ML-% IJ SOLN
INTRAMUSCULAR | Status: AC
  Filled 2017-02-05: qty 500

## 2017-02-05 MED ORDER — CEFAZOLIN IN D5W 1 GM/50ML IV SOLN
1.0000 g | Freq: Four times a day (QID) | INTRAVENOUS | Status: AC
  Administered 2017-02-05 – 2017-02-06 (×3): 1 g via INTRAVENOUS
  Filled 2017-02-05 (×3): qty 50

## 2017-02-05 MED ORDER — MUPIROCIN 2 % EX OINT
TOPICAL_OINTMENT | CUTANEOUS | Status: AC
  Filled 2017-02-05: qty 22

## 2017-02-05 MED ORDER — CHLORTHALIDONE 25 MG PO TABS
25.0000 mg | ORAL_TABLET | Freq: Every day | ORAL | Status: DC
  Administered 2017-02-06: 25 mg via ORAL
  Filled 2017-02-05: qty 1

## 2017-02-05 MED ORDER — HEPARIN (PORCINE) IN NACL 2-0.9 UNIT/ML-% IJ SOLN
INTRAMUSCULAR | Status: DC | PRN
  Administered 2017-02-05: 500 mL

## 2017-02-05 MED ORDER — GLIPIZIDE ER 10 MG PO TB24
10.0000 mg | ORAL_TABLET | Freq: Every day | ORAL | Status: DC
  Administered 2017-02-06: 10 mg via ORAL
  Filled 2017-02-05: qty 1

## 2017-02-05 MED ORDER — ASPIRIN EC 81 MG PO TBEC
81.0000 mg | DELAYED_RELEASE_TABLET | Freq: Every day | ORAL | Status: DC
  Administered 2017-02-05 – 2017-02-06 (×2): 81 mg via ORAL
  Filled 2017-02-05 (×2): qty 1

## 2017-02-05 MED ORDER — LIDOCAINE HCL (PF) 1 % IJ SOLN
INTRAMUSCULAR | Status: DC | PRN
  Administered 2017-02-05: 40 mL

## 2017-02-05 MED ORDER — SODIUM CHLORIDE 0.9 % IR SOLN
80.0000 mg | Status: AC
  Administered 2017-02-05: 80 mg

## 2017-02-05 MED ORDER — ALBUTEROL SULFATE (2.5 MG/3ML) 0.083% IN NEBU: 3.0000 mL | INHALATION_SOLUTION | Freq: Four times a day (QID) | RESPIRATORY_TRACT | Status: DC | PRN

## 2017-02-05 MED ORDER — LIDOCAINE HCL (PF) 1 % IJ SOLN
INTRAMUSCULAR | Status: AC
  Filled 2017-02-05: qty 30

## 2017-02-05 MED ORDER — FENTANYL CITRATE (PF) 100 MCG/2ML IJ SOLN
INTRAMUSCULAR | Status: DC | PRN
  Administered 2017-02-05 (×2): 25 ug via INTRAVENOUS

## 2017-02-05 MED ORDER — MIDAZOLAM HCL 5 MG/5ML IJ SOLN
INTRAMUSCULAR | Status: AC
  Filled 2017-02-05: qty 5

## 2017-02-05 MED ORDER — BENAZEPRIL HCL 20 MG PO TABS
20.0000 mg | ORAL_TABLET | Freq: Every day | ORAL | Status: DC
  Administered 2017-02-06: 20 mg via ORAL
  Filled 2017-02-05: qty 1
  Filled 2017-02-05: qty 4

## 2017-02-05 MED ORDER — SODIUM CHLORIDE 0.9 % IV SOLN
INTRAVENOUS | Status: DC
  Administered 2017-02-05: 12:00:00 via INTRAVENOUS

## 2017-02-05 MED ORDER — ACETAMINOPHEN 325 MG PO TABS
325.0000 mg | ORAL_TABLET | ORAL | Status: DC | PRN
  Administered 2017-02-05: 650 mg via ORAL
  Filled 2017-02-05: qty 2

## 2017-02-05 MED ORDER — CEFAZOLIN SODIUM-DEXTROSE 2-4 GM/100ML-% IV SOLN
2.0000 g | INTRAVENOUS | Status: AC
  Administered 2017-02-05: 2 g via INTRAVENOUS

## 2017-02-05 MED ORDER — MOMETASONE FURO-FORMOTEROL FUM 200-5 MCG/ACT IN AERO
2.0000 | INHALATION_SPRAY | Freq: Two times a day (BID) | RESPIRATORY_TRACT | Status: DC
  Administered 2017-02-05 – 2017-02-06 (×2): 2 via RESPIRATORY_TRACT
  Filled 2017-02-05: qty 8.8

## 2017-02-05 MED ORDER — SODIUM CHLORIDE 0.9 % IV SOLN
INTRAVENOUS | Status: DC
Start: 2017-02-05 — End: 2017-02-05
  Administered 2017-02-05: 12:00:00 via INTRAVENOUS

## 2017-02-05 SURGICAL SUPPLY — 13 items
CABLE SURGICAL S-101-97-12 (CABLE) ×6 IMPLANT
CATH RIGHTSITE C315HIS02 (CATHETERS) ×2 IMPLANT
HEMOSTAT SURGICEL 2X4 FIBR (HEMOSTASIS) ×2 IMPLANT
IPG PACE AZUR XT DR MRI W1DR01 (Pacemaker) ×1 IMPLANT
LEAD CAPSURE NOVUS 5076-58CM (Lead) ×2 IMPLANT
LEAD SELECT SECURE 3830 383069 (Lead) ×1 IMPLANT
PACE AZURE XT DR MRI W1DR01 (Pacemaker) ×2 IMPLANT
PAD DEFIB LIFELINK (PAD) ×2 IMPLANT
SELECT SECURE 3830 383069 (Lead) ×2 IMPLANT
SHEATH CLASSIC 7F (SHEATH) ×4 IMPLANT
SLITTER 6232ADJ (MISCELLANEOUS) ×2 IMPLANT
TRAY PACEMAKER INSERTION (PACKS) ×2 IMPLANT
WIRE HI TORQ VERSACORE-J 145CM (WIRE) ×2 IMPLANT

## 2017-02-05 NOTE — H&P (View-Only) (Signed)
Patient Care Team: Rafael Bihari, MD as PCP - General (Internal Medicine)   HPI  Fernando [name] is a 82 y.o. male Seen in follow-up for dyspnea associated with complete heart block and a narrow QRS escape. Treadmill testing demonstrated chronotropic incompetence with a maximal ventricular rate of about 78 bpm with 2-1 heart block. Treadmill was personally reviewed.  Records and Results Reviewed As above  DATE TEST    2/17    Myoview   EF 56` % No ischemia exercised only 3'51" min--Max HR 115  2/17    echo   EF 55-60 % Normal LA and mild RVE and RAE        He continues with significant dyspnea on exertion at less than one flight of stairs and carrying groceries. It is relieved by rest. He does not have edema or chest discomfort.  He remains with poorly controlled hypertension  Past Medical History:  Diagnosis Date  . Asthma   . Complete heart block (HCC) intermittent narrow QRS escape  06/28/2016  . Diabetes mellitus without complication (HCC)   . Hypercholesteremia   . Hypertension   . Mobitz type 2 second degree heart block  2:1 heart block 06/28/2016    Past Surgical History:  Procedure Laterality Date  . Left Arm Surgery      Current Outpatient Prescriptions  Medication Sig Dispense Refill  . albuterol (PROVENTIL HFA;VENTOLIN HFA) 108 (90 Base) MCG/ACT inhaler Inhale 2 puffs into the lungs every 6 (six) hours as needed for wheezing or shortness of breath.    [name] amLODipine (NORVASC) 10 MG tablet Take 10 mg by mouth 2 (two) times daily.    [name] aspirin EC 81 MG tablet Take 81 mg by mouth daily.    . benazepril (LOTENSIN) 10 MG tablet Take 10 mg by mouth daily.    . carvedilol (COREG) 6.25 MG tablet Take 6.25 mg by mouth 2 (two) times daily with a meal.    . chlorthalidone (HYGROTON) 25 MG tablet Take 25 mg by mouth daily.    . Fluticasone-Salmeterol (ADVAIR) 250-50 MCG/DOSE AEPB Inhale 1 puff into the lungs 2 (two) times daily.    [name] glipiZIDE (GLUCOTROL XL) 10  MG 24 hr tablet Take 10 mg by mouth daily with breakfast.    . montelukast (SINGULAIR) 10 MG tablet Take 10 mg by mouth at bedtime.    . simvastatin (ZOCOR) 40 MG tablet Take 40 mg by mouth at bedtime.     No current facility-administered medications for this visit.     Allergies  Allergen Reactions  . Shellfish Allergy Anaphylaxis      Review of Systems negative except from HPI and PMH  Physical Exam BP (!) 170/68 (BP Location: Right Arm, Patient Position: Sitting, Cuff Size: Normal)   Pulse (!) 55   Ht 5\' 9"  (1.753 m)   Wt 192 lb (87.1 kg)   BMI 28.35 kg/m  Well developed and well nourished in no acute distress HENT normal E scleral and icterus clear Neck Supple JVP flat; carotids brisk and full Clear to ausculation  Regular rate and rhythm, no murmurs gallops or rub Soft with active bowel sounds No clubbing cyanosis  Edema Alert and oriented, grossly normal motor and sensory function Skin Warm and Dry  ECG demonstrates complete heart block with a narrow QRS escape sinus rate is about 10 5 bpm ventricular rate 55  Assessment and  Plan  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 Intermittent complete with narrow  QRS heart rates less than 40  Dyspnea on exertion  Hypertension  Diabetes   The patient has persistent dyspnea on exertion with documented complete heart block chronotropic incompetence. It is unlikely related to the beta blocker; we will discontinue it. He will require pacing and we will anticipate His bundle ventricular pacing lead location   Blood pressures poorly controlled. This may be aggravated by his heart block as suggested by the sinus tachycardia underlying. For right now we will not add further medications. In the context of his diabetes he is also an ACE inhibitor which is appropriate  The benefits and risks were reviewed including but not limited to death,  perforation, infection, lead dislodgement and device malfunction.  The patient understands agrees  and is willing to proceed.      Current medicines are reviewed at length with the patient today .  The patient does not  have concerns regarding medicines. More than 50% of 45 min was spent in counseling related to the above

## 2017-02-05 NOTE — Interval H&P Note (Signed)
History and Physical Interval Note:  02/05/2017 1:29 PM  Otho DarnerWilliam Lee Guerrier  has presented today for surgery, with the diagnosis of hb  The various methods of treatment have been discussed with the patient and family. After consideration of risks, benefits and other options for treatment, the patient has consented to  Procedure(s): Pacemaker Implant (N/A) as a surgical intervention .  The patient's history has been reviewed, patient examined, no change in status, stable for surgery.  I have reviewed the patient's chart and labs.  Questions were answered to the patient's satisfaction.     Sherryl MangesSteven Klein  Anxious but otherwise ok

## 2017-02-05 NOTE — Discharge Summary (Signed)
ELECTROPHYSIOLOGY PROCEDURE DISCHARGE SUMMARY    Patient ID: Fernando [name],  MRN: [id number], DOB/AGE: 05-03-1935 82 y.o.  Admit date: 02/05/2017 Discharge date: 02/06/17  Primary Care Physician: Rafael BihariWALKER III, JOHN B, MD  Primary Cardiologist/Electrophysiologist: Dr. Graciela HusbandsKlein  Primary Discharge Diagnosis:  1. High degree heart block     chronotropic incompitance     status post pacemaker implantation this admission  Secondary Discharge Diagnosis:  1. HTN 2. DM  Allergies  Allergen Reactions  . Shellfish Allergy Anaphylaxis     Procedures This Admission:  1.  Implantation of a MDT dual chamber PPM on 02/05/17 by Dr Graciela HusbandsKlein.  The patient received a Medtronic MRI compatible pulse generator serial number [id number] H. Medtronic MRI compatible 3830 active fixation lead serial number [id number] V RV lead is in the HIS position, and a Medtronic right atrial lead serial number [id number]  There were no immediate post procedure complications. 2.  CXR on 02/06/17 demonstrated no pneumothorax status post device implantation.   Brief HPI: Fernando [name] is a 82 y.o. male was referred to electrophysiology in the outpatient setting for consideration of PPM implantation.  Past medical history includes high degree AVBlock, HTN, DM.  The patient has had symptomatic bradycardia without reversible causes identified.  Risks, benefits, and alternatives to PPM implantation were reviewed with the patient who wished to proceed.   Hospital Course:  The patient was admitted and underwent implantation of a PPM with details as outlined above.  He was monitored on telemetry overnight which demonstrated SR/ Vpaced rhythm and one very brief NSVT on his telemetry.  Left chest was without hematoma or ecchymosis.  The device was interrogated and found to be functioning normally.  CXR was obtained and demonstrated no pneumothorax status post device implantation.  Wound care, arm mobility, and restrictions were reviewed  with the patient.  The patient was examined by Dr. Graciela HusbandsKLein and considered stable for discharge to home.  We will increase his carvedilol.   Physical Exam: Vitals:   02/05/17 2247 02/06/17 0636 02/06/17 0657 02/06/17 0856  BP: (!) 181/77 (!) 185/88 (!) 179/85   Pulse: 67 79 68   Resp:  16    Temp:  98.9 F (37.2 C)    TempSrc:  Oral    SpO2:  96%  97%  Weight:      Height:        GEN- The patient is well appearing, alert and oriented x 3 today.   HEENT: normocephalic, atraumatic; sclera clear, conjunctiva pink; hearing intact; oropharynx clear; neck supple, no JVP Lungs- CTA b/l, normal work of breathing.  No wheezes, rales, rhonchi Heart- RRR, no murmurs, rubs or gallops, PMI not laterally displaced GI- soft, non-tender, non-distended Extremities- no clubbing, cyanosis, or edema MS- no significant deformity or atrophy Skin- warm and dry, no rash or lesion, left chest without hematoma/ecchymosis Psych- euthymic mood, full affect Neuro- no gross deficits   Labs:   Lab Results  Component Value Date   WBC 6.4 01/23/2017   HGB 14.6 01/30/2016   HCT 40.0 01/23/2017   MCV 86 01/23/2017   PLT 215 01/23/2017   No results for input(s): NA, K, CL, CO2, BUN, CREATININE, CALCIUM, PROT, BILITOT, ALKPHOS, ALT, AST, GLUCOSE in the last 168 hours.  Invalid input(s): LABALBU  Discharge Medications:  Allergies as of 02/06/2017      Reactions   Shellfish Allergy Anaphylaxis      Medication List    TAKE these medications   albuterol 108 (90 Base)  MCG/ACT inhaler Commonly known as:  PROVENTIL HFA;VENTOLIN HFA Inhale 2 puffs into the lungs every 6 (six) hours as needed for wheezing or shortness of breath.   amLODipine 10 MG tablet Commonly known as:  NORVASC Take 10 mg by mouth 2 (two) times daily.   aspirin EC 81 MG tablet Take 81 mg by mouth daily.   benazepril 10 MG tablet Commonly known as:  LOTENSIN Take 20 mg by mouth daily.   carvedilol 12.5 MG tablet Commonly known as:   COREG Take 1 tablet (12.5 mg total) by mouth 2 (two) times daily with a meal. What changed:  medication strength  how much to take   chlorthalidone 25 MG tablet Commonly known as:  HYGROTON Take 25 mg by mouth daily.   Fluticasone-Salmeterol 250-50 MCG/DOSE Aepb Commonly known as:  ADVAIR Inhale 1 puff into the lungs 2 (two) times daily.   glipiZIDE 10 MG 24 hr tablet Commonly known as:  GLUCOTROL XL Take 10 mg by mouth daily with breakfast.   montelukast 10 MG tablet Commonly known as:  SINGULAIR Take 10 mg by mouth at bedtime.   simvastatin 40 MG tablet Commonly known as:  ZOCOR Take 40 mg by mouth at bedtime.       Disposition: Home Discharge Instructions    Diet - low sodium heart healthy    Complete by:  As directed    Increase activity slowly    Complete by:  As directed      Follow-up Information    [facility] Follow up on 02/19/2017.   Specialty:  Cardiology Why:  9:00AM, wound check Contact information: [address], [address] [place] [address] [phone]       Sherryl Manges, MD Follow up on 05/13/2017.   Specialty:  Cardiology Why:  10:30AM Contact information: [address] [place] [address] [phone]           Duration of Discharge Encounter: Greater than 30 minutes including physician time.  Norma Fredrickson, PA-C 02/06/2017 8:59 AM  Instructions given

## 2017-02-06 ENCOUNTER — Ambulatory Visit (HOSPITAL_COMMUNITY): Payer: Medicare HMO

## 2017-02-06 ENCOUNTER — Encounter (HOSPITAL_COMMUNITY): Payer: Self-pay | Admitting: Internal Medicine

## 2017-02-06 DIAGNOSIS — I10 Essential (primary) hypertension: Secondary | ICD-10-CM | POA: Diagnosis not present

## 2017-02-06 DIAGNOSIS — E119 Type 2 diabetes mellitus without complications: Secondary | ICD-10-CM | POA: Diagnosis not present

## 2017-02-06 DIAGNOSIS — I4589 Other specified conduction disorders: Secondary | ICD-10-CM | POA: Diagnosis not present

## 2017-02-06 DIAGNOSIS — Z7984 Long term (current) use of oral hypoglycemic drugs: Secondary | ICD-10-CM | POA: Diagnosis not present

## 2017-02-06 DIAGNOSIS — I442 Atrioventricular block, complete: Secondary | ICD-10-CM

## 2017-02-06 DIAGNOSIS — Z91013 Allergy to seafood: Secondary | ICD-10-CM | POA: Diagnosis not present

## 2017-02-06 DIAGNOSIS — Z7982 Long term (current) use of aspirin: Secondary | ICD-10-CM | POA: Diagnosis not present

## 2017-02-06 DIAGNOSIS — E7800 Pure hypercholesterolemia, unspecified: Secondary | ICD-10-CM | POA: Diagnosis not present

## 2017-02-06 DIAGNOSIS — Z950 Presence of cardiac pacemaker: Secondary | ICD-10-CM | POA: Diagnosis not present

## 2017-02-06 DIAGNOSIS — J45909 Unspecified asthma, uncomplicated: Secondary | ICD-10-CM | POA: Diagnosis not present

## 2017-02-06 MED ORDER — CARVEDILOL 12.5 MG PO TABS: 12.5000 mg | ORAL_TABLET | Freq: Two times a day (BID) | ORAL | 6 refills | Status: DC

## 2017-02-06 MED ORDER — ATORVASTATIN CALCIUM 20 MG PO TABS: 20.0000 mg | ORAL_TABLET | Freq: Every day | ORAL | Status: DC

## 2017-02-06 NOTE — Discharge Instructions (Signed)
° ° °  Supplemental Discharge Instructions for  Pacemaker/Defibrillator Patients  Activity No heavy lifting or vigorous activity with your left/right arm for 6 to 8 weeks.  Do not raise your left/right arm above your head for one week.  Gradually raise your affected arm as drawn below.              02/09/17                    02/10/17                     02/11/17                  02/12/17 __  NO DRIVING the patient does not drive WOUND CARE - Keep the wound area clean and dry.  Do not get this area wet for one week. No showers for one week; you may shower on 02/12/17    . - The tape/steri-strips on your wound will fall off; do not pull them off.  No bandage is needed on the site.  DO  NOT apply any creams, oils, or ointments to the wound area. - If you notice any drainage or discharge from the wound, any swelling or bruising at the site, or you develop a fever > 101? F after you are discharged home, call the office at once.  Special Instructions - You are still able to use cellular telephones; use the ear opposite the side where you have your pacemaker/defibrillator.  Avoid carrying your cellular phone near your device. - When traveling through airports, show security personnel your identification card to avoid being screened in the metal detectors.  Ask the security personnel to use the hand wand. - Avoid arc welding equipment, MRI testing (magnetic resonance imaging), TENS units (transcutaneous nerve stimulators).  Call the office for questions about other devices. - Avoid electrical appliances that are in poor condition or are not properly grounded. - Microwave ovens are safe to be near or to operate.  Additional information for defibrillator patients should your device go off: - If your device goes off ONCE and you feel fine afterward, notify the device clinic nurses. - If your device goes off ONCE and you do not feel well afterward, call 911. - If your device goes off TWICE, call 911. - If  your device goes off THREE times in one day, call 911.  DO NOT DRIVE YOURSELF OR A FAMILY MEMBER WITH A DEFIBRILLATOR TO THE HOSPITAL--CALL 911.

## 2017-02-12 DIAGNOSIS — E1121 Type 2 diabetes mellitus with diabetic nephropathy: Secondary | ICD-10-CM | POA: Diagnosis not present

## 2017-02-19 ENCOUNTER — Ambulatory Visit (INDEPENDENT_AMBULATORY_CARE_PROVIDER_SITE_OTHER): Payer: Medicare HMO | Admitting: *Deleted

## 2017-02-19 ENCOUNTER — Encounter: Payer: Self-pay | Admitting: Internal Medicine

## 2017-02-19 DIAGNOSIS — I441 Atrioventricular block, second degree: Secondary | ICD-10-CM | POA: Diagnosis not present

## 2017-02-19 LAB — CUP PACEART INCLINIC DEVICE CHECK
Battery Remaining Longevity: 47 mo
Battery Voltage: 3.18 V
Brady Statistic AP VP Percent: 2.41 %
Brady Statistic AS VS Percent: 0.42 %
Implantable Lead Implant Date: 20180207
Implantable Lead Location: 753860
Implantable Pulse Generator Implant Date: 20180207
Lead Channel Impedance Value: 361 Ohm
Lead Channel Impedance Value: 570 Ohm
Lead Channel Pacing Threshold Amplitude: 0.75 V
Lead Channel Pacing Threshold Pulse Width: 1 ms
Lead Channel Sensing Intrinsic Amplitude: 6.875 mV
Lead Channel Sensing Intrinsic Amplitude: 7.375 mV
Lead Channel Setting Pacing Amplitude: 5 V
MDC IDC LEAD IMPLANT DT: 20180207
MDC IDC LEAD LOCATION: 753859
MDC IDC MSMT LEADCHNL RA PACING THRESHOLD AMPLITUDE: 0.75 V
MDC IDC MSMT LEADCHNL RA PACING THRESHOLD PULSEWIDTH: 0.4 ms
MDC IDC MSMT LEADCHNL RV IMPEDANCE VALUE: 323 Ohm
MDC IDC MSMT LEADCHNL RV IMPEDANCE VALUE: 418 Ohm
MDC IDC MSMT LEADCHNL RV SENSING INTR AMPL: 10.25 mV
MDC IDC SESS DTM: 20180221142006
MDC IDC SET LEADCHNL RA PACING AMPLITUDE: 3.5 V
MDC IDC SET LEADCHNL RV PACING PULSEWIDTH: 1 ms
MDC IDC SET LEADCHNL RV SENSING SENSITIVITY: 2 mV
MDC IDC STAT BRADY AP VS PERCENT: 0 %
MDC IDC STAT BRADY AS VP PERCENT: 97.17 %
MDC IDC STAT BRADY RA PERCENT PACED: 2.59 %
MDC IDC STAT BRADY RV PERCENT PACED: 99.58 %

## 2017-02-19 NOTE — Progress Notes (Signed)
Wound check appointment. Steri-strips removed. Wound without redness or edema. Incision edges approximated, wound well healed. Normal device function. Thresholds, sensing, and impedances consistent with implant measurements. Device programmed at 3.5V(RA) and 5V(His lead) for extra safety margin until 3 month visit. Rhythm strip obtained, intermittent selective capture noted from 5V to 0.75V (loss of capture). AS reviewed strip and no changes were recommended at this time. Histogram distribution appropriate for patient and level of activity. No mode switches or high ventricular rates noted. Patient educated about wound care, arm mobility, lifting restrictions. ROV in 4 weeks with AS and 3 months with SK.

## 2017-02-20 DIAGNOSIS — E1121 Type 2 diabetes mellitus with diabetic nephropathy: Secondary | ICD-10-CM | POA: Diagnosis not present

## 2017-02-20 DIAGNOSIS — E7800 Pure hypercholesterolemia, unspecified: Secondary | ICD-10-CM | POA: Diagnosis not present

## 2017-02-20 DIAGNOSIS — I10 Essential (primary) hypertension: Secondary | ICD-10-CM | POA: Diagnosis not present

## 2017-02-20 DIAGNOSIS — Z0001 Encounter for general adult medical examination with abnormal findings: Secondary | ICD-10-CM | POA: Diagnosis not present

## 2017-02-20 DIAGNOSIS — J4520 Mild intermittent asthma, uncomplicated: Secondary | ICD-10-CM | POA: Diagnosis not present

## 2017-02-27 DIAGNOSIS — E1142 Type 2 diabetes mellitus with diabetic polyneuropathy: Secondary | ICD-10-CM | POA: Diagnosis not present

## 2017-02-27 DIAGNOSIS — B351 Tinea unguium: Secondary | ICD-10-CM | POA: Diagnosis not present

## 2017-03-12 ENCOUNTER — Encounter: Payer: Self-pay | Admitting: Nurse Practitioner

## 2017-03-20 ENCOUNTER — Encounter: Payer: Self-pay | Admitting: Nurse Practitioner

## 2017-03-20 ENCOUNTER — Ambulatory Visit (INDEPENDENT_AMBULATORY_CARE_PROVIDER_SITE_OTHER): Payer: Medicare HMO | Admitting: Nurse Practitioner

## 2017-03-20 ENCOUNTER — Encounter: Payer: Self-pay | Admitting: Internal Medicine

## 2017-03-20 VITALS — BP 158/80 | HR 70 | Ht 69.0 in | Wt 192.0 lb

## 2017-03-20 DIAGNOSIS — I10 Essential (primary) hypertension: Secondary | ICD-10-CM

## 2017-03-20 DIAGNOSIS — I442 Atrioventricular block, complete: Secondary | ICD-10-CM

## 2017-03-20 LAB — CUP PACEART INCLINIC DEVICE CHECK
Implantable Lead Model: 3830
Implantable Lead Model: 5076
Implantable Pulse Generator Implant Date: 20180207
MDC IDC LEAD IMPLANT DT: 20180207
MDC IDC LEAD IMPLANT DT: 20180207
MDC IDC LEAD LOCATION: 753859
MDC IDC LEAD LOCATION: 753860
MDC IDC SESS DTM: 20180322124831

## 2017-03-20 NOTE — Progress Notes (Signed)
Electrophysiology Office Note Date: 03/20/2017  ID:  Fernando, [name] 1935-07-07, MRN [id number]  PCP: Rafael Bihari, MD Primary Cardiologist: [facility] Electrophysiologist: Graciela Husbands  CC: His Bundle pacemaker follow-up  Fernando [name] is a 82 y.o. male seen today for Dr Graciela Husbands.  He presents today for routine electrophysiology followup.  Since last being seen in our clinic, the patient reports doing very well. He has noticed improvement in exercise tolerance and shortness of breath post pacemaker implant.  He denies chest pain, palpitations, dyspnea, PND, orthopnea, nausea, vomiting, dizziness, syncope, edema, weight gain, or early satiety.  He is a retired Psychologist, educational.   Device History: MDT His Bundle dual chamber PPM implanted 2018 for [facility]   Past Medical History:  Diagnosis Date  . Asthma   . Complete heart block (HCC) 06/28/2016   a. s/p MDT His Bundle pacemaker 2018  . Diabetes mellitus without complication (HCC)   . Hypercholesteremia   . Hypertension    Past Surgical History:  Procedure Laterality Date  . Left Arm Surgery    . PACEMAKER IMPLANT N/A 02/05/2017   MDT His Bundle dual chamber PPM implanted by Dr Graciela Husbands for [facility]     Current Outpatient Prescriptions  Medication Sig Dispense Refill  . albuterol (PROVENTIL HFA;VENTOLIN HFA) 108 (90 Base) MCG/ACT inhaler Inhale 2 puffs into the lungs every 6 (six) hours as needed for wheezing or shortness of breath.    [name] amLODipine (NORVASC) 10 MG tablet Take 10 mg by mouth 2 (two) times daily.    [name] aspirin EC 81 MG tablet Take 81 mg by mouth daily.    . benazepril (LOTENSIN) 10 MG tablet Take 20 mg by mouth daily.     . carvedilol (COREG) 12.5 MG tablet Take 1 tablet (12.5 mg total) by mouth 2 (two) times daily with a meal. 60 tablet 6  . chlorthalidone (HYGROTON) 25 MG tablet Take 25 mg by mouth daily.    . Fluticasone-Salmeterol (ADVAIR) 250-50 MCG/DOSE AEPB Inhale 1 puff into the lungs 2 (two)  times daily.    [name] glipiZIDE (GLUCOTROL XL) 10 MG 24 hr tablet Take 10 mg by mouth daily with breakfast.    . montelukast (SINGULAIR) 10 MG tablet Take 10 mg by mouth at bedtime.    . simvastatin (ZOCOR) 40 MG tablet Take 40 mg by mouth at bedtime.     No current facility-administered medications for this visit.     Allergies:   Shellfish allergy   Social History: Social History   Social History  . Marital status: Married    Spouse name: N/A  . Number of children: N/A  . Years of education: N/A   Occupational History  . Not on file.   Social History Main Topics  . Smoking status: Never Smoker  . Smokeless tobacco: Never Used  . Alcohol use No  . Drug use: No  . Sexual activity: Not on file   Other Topics Concern  . Not on file   Social History Narrative  . No narrative on file   Review of Systems: All other systems reviewed and are otherwise negative except as noted above.   Physical Exam: VS:  BP (!) 158/80   Pulse 70   Ht 5\' 9"  (1.753 m)   Wt 192 lb (87.1 kg)   BMI 28.35 kg/m  , BMI Body mass index is 28.35 kg/m.  GEN- The patient is well appearing, alert and oriented x 3 today.   HEENT:  normocephalic, atraumatic; sclera clear, conjunctiva pink; hearing intact; oropharynx clear; neck supple  Lungs- Clear to ausculation bilaterally, normal work of breathing.  No wheezes, rales, rhonchi Heart- Regular rate and rhythm (paced) GI- soft, non-tender, non-distended, bowel sounds present  Extremities- no clubbing, cyanosis, or edema  MS- no significant deformity or atrophy Skin- warm and dry, no rash or lesion; PPM pocket well healed Psych- euthymic mood, full affect Neuro- strength and sensation are intact  PPM Interrogation- reviewed in detail today,  See PACEART report  EKG:  EKG is ordered today. The ekg ordered today shows sinus rhythm with V pacing   Recent Labs: 01/23/2017: BUN 20; Creatinine, Ser 1.26; Platelets 215; Potassium 4.2; Sodium 138   Wt  Readings from Last 3 Encounters:  03/20/17 192 lb (87.1 kg)  02/05/17 191 lb 9.3 oz (86.9 kg)  01/23/17 192 lb (87.1 kg)     Other studies Reviewed: Additional studies/ records that were reviewed today include: Dr Odessa FlemingKlein's office notes, hospital records   Assessment and Plan:  1.  Complete heart block  Normal PPM function See Pace Art report No changes today His Bundle capture non selective all the way to loss of capture at 0.75V @1msec  Output decreased today to 2.5V@1msec   2.  HTN Stable No change required today   Current medicines are reviewed at length with the patient today.   The patient does not have concerns regarding his medicines.  The following changes were made today:  none  Labs/ tests ordered today include: none No orders of the defined types were placed in this encounter.    Disposition:   Follow up with Dr Graciela HusbandsKlein as scheduled    Signed, Gypsy BalsamAmber Malayasia Mirkin, NP 03/20/2017 9:46 AM  [facility] [address] [place] [address] [phone] (office) [phone] (fax)

## 2017-04-05 DIAGNOSIS — Z950 Presence of cardiac pacemaker: Secondary | ICD-10-CM | POA: Diagnosis not present

## 2017-04-05 DIAGNOSIS — J45909 Unspecified asthma, uncomplicated: Secondary | ICD-10-CM | POA: Diagnosis not present

## 2017-04-05 DIAGNOSIS — E119 Type 2 diabetes mellitus without complications: Secondary | ICD-10-CM | POA: Diagnosis not present

## 2017-04-05 DIAGNOSIS — I4891 Unspecified atrial fibrillation: Secondary | ICD-10-CM | POA: Diagnosis not present

## 2017-04-05 DIAGNOSIS — R3915 Urgency of urination: Secondary | ICD-10-CM | POA: Diagnosis not present

## 2017-04-05 DIAGNOSIS — I10 Essential (primary) hypertension: Secondary | ICD-10-CM | POA: Diagnosis not present

## 2017-04-05 DIAGNOSIS — B351 Tinea unguium: Secondary | ICD-10-CM | POA: Diagnosis not present

## 2017-04-05 DIAGNOSIS — Z0000 Encounter for general adult medical examination without abnormal findings: Secondary | ICD-10-CM | POA: Diagnosis not present

## 2017-04-05 DIAGNOSIS — Z7982 Long term (current) use of aspirin: Secondary | ICD-10-CM | POA: Diagnosis not present

## 2017-04-05 DIAGNOSIS — G4700 Insomnia, unspecified: Secondary | ICD-10-CM | POA: Diagnosis not present

## 2017-04-05 DIAGNOSIS — Z7984 Long term (current) use of oral hypoglycemic drugs: Secondary | ICD-10-CM | POA: Diagnosis not present

## 2017-04-05 DIAGNOSIS — E785 Hyperlipidemia, unspecified: Secondary | ICD-10-CM | POA: Diagnosis not present

## 2017-04-05 DIAGNOSIS — R1310 Dysphagia, unspecified: Secondary | ICD-10-CM | POA: Diagnosis not present

## 2017-04-05 DIAGNOSIS — Z79899 Other long term (current) drug therapy: Secondary | ICD-10-CM | POA: Diagnosis not present

## 2017-05-13 ENCOUNTER — Encounter: Payer: Self-pay | Admitting: Internal Medicine

## 2017-05-13 ENCOUNTER — Ambulatory Visit (INDEPENDENT_AMBULATORY_CARE_PROVIDER_SITE_OTHER): Payer: Medicare HMO | Admitting: Internal Medicine

## 2017-05-13 VITALS — BP 158/72 | HR 62 | Ht 69.0 in | Wt 189.5 lb

## 2017-05-13 DIAGNOSIS — I442 Atrioventricular block, complete: Secondary | ICD-10-CM

## 2017-05-13 DIAGNOSIS — Z959 Presence of cardiac and vascular implant and graft, unspecified: Secondary | ICD-10-CM | POA: Diagnosis not present

## 2017-05-13 DIAGNOSIS — I441 Atrioventricular block, second degree: Secondary | ICD-10-CM | POA: Diagnosis not present

## 2017-05-13 NOTE — Patient Instructions (Signed)
Medication Instructions: - Your physician recommends that you continue on your current medications as directed. Please refer to the Current Medication list given to you today.  Labwork: - none ordered  Procedures/Testing: - none ordered  Follow-Up: - Remote monitoring is used to monitor your Pacemaker of ICD from home. This monitoring reduces the number of office visits required to check your device to one time per year. It allows us to keep an eye on the functioning of your device to ensure it is working properly. You are scheduled for a device check from home on 08/12/17. You may send your transmission at any time that day. If you have a wireless device, the transmission will be sent automatically. After your physician reviews your transmission, you will receive a postcard with your next transmission date.  - Your physician wants you to follow-up in: 9 months with Dr. Graciela HusbandsKlein. You will receive a reminder letter in the mail two months in advance. If you don't receive a letter, please call our office to schedule the follow-up appointment.  Any Additional Special Instructions Will Be Listed Below (If Applicable).     If you need a refill on your cardiac medications before your next appointment, please call your pharmacy.

## 2017-05-13 NOTE — Progress Notes (Signed)
Patient Care Team: Rafael BihariWalker, John B III, MD as PCP - General (Internal Medicine)   HPI  Fernando [name] is a 82 y.o. male Seen in follow-up for dyspnea associated with complete heart block and a narrow QRS escape for which  he will underwent His bundle pacing 2/18.[name].  Records and Results Reviewed As above   DATE TEST    2/17    Myoview   EF 56` % No ischemia exercised only 3'51" min--Max HR 115  2/17    echo   EF 55-60 % Normal LA and mild RVE and RAE        His dyspnea is vastly improved following pacing. He is now mowing the yard and doing his daily chores. He denies chest pain or shortness of breath.     Past Medical History:  Diagnosis Date  . Asthma   . Complete heart block (HCC) 06/28/2016   a. s/p MDT His Bundle pacemaker 2018  . Diabetes mellitus without complication (HCC)   . Hypercholesteremia   . Hypertension     Past Surgical History:  Procedure Laterality Date  . Left Arm Surgery    . PACEMAKER IMPLANT N/A 02/05/2017   MDT His Bundle dual chamber PPM implanted by Dr Graciela HusbandsKlein for [facility]     Current Outpatient Prescriptions  Medication Sig Dispense Refill  . albuterol (PROVENTIL HFA;VENTOLIN HFA) 108 (90 Base) MCG/ACT inhaler Inhale 2 puffs into the lungs every 6 (six) hours as needed for wheezing or shortness of breath.    [name]. amLODipine (NORVASC) 10 MG tablet Take 10 mg by mouth 2 (two) times daily.    [name]. aspirin EC 81 MG tablet Take 81 mg by mouth daily.    . benazepril (LOTENSIN) 10 MG tablet Take 20 mg by mouth daily.     . carvedilol (COREG) 12.5 MG tablet Take 1 tablet (12.5 mg total) by mouth 2 (two) times daily with a meal. 60 tablet 6  . chlorthalidone (HYGROTON) 25 MG tablet Take 25 mg by mouth daily.    . Fluticasone-Salmeterol (ADVAIR) 250-50 MCG/DOSE AEPB Inhale 1 puff into the lungs 2 (two) times daily.    [name]. glipiZIDE (GLUCOTROL XL) 10 MG 24 hr tablet Take 10 mg by mouth daily with breakfast.    . simvastatin (ZOCOR) 40 MG tablet Take 40 mg by  mouth at bedtime.     No current facility-administered medications for this visit.     Allergies  Allergen Reactions  . Shellfish Allergy Anaphylaxis      Review of Systems negative except from HPI and PMH  Physical Exam BP (!) 158/72 (BP Location: Right Arm, Patient Position: Sitting, Cuff Size: Normal)   Pulse 62   Ht 5\' 9"  (1.753 m)   Wt 189 lb 8 oz (86 kg)   BMI 27.98 kg/m  Well developed and well nourished in no acute distress HENT normal E scleral and icterus clear Neck Supple JVP flat; carotids brisk and full Clear to ausculation  Regular rate and rhythm, no murmurs gallops or rub Soft with active bowel sounds No clubbing cyanosis  Edema Alert and oriented, grossly normal motor and sensory function Skin Warm and Dry  ECG demonstrates Sinus rhythm and PVCs synchronous His bundle pacing with nonselective capture down the threshold   Assessment and  Plan  High-grade heart block 2:1   Pacemaker-His bundle   Hypertension  Diabetes   Patient's dyspnea is vastly improved. Device function is normal. We'll need to continue to  work on blood pressure management per his PCP.      Current medicines are reviewed at length with the patient today .  The patient does not  have concerns regarding medicines. More than 50% of 45 min was spent in counseling related to the above

## 2017-05-14 DIAGNOSIS — E1121 Type 2 diabetes mellitus with diabetic nephropathy: Secondary | ICD-10-CM | POA: Diagnosis not present

## 2017-05-27 DIAGNOSIS — I10 Essential (primary) hypertension: Secondary | ICD-10-CM | POA: Diagnosis not present

## 2017-05-27 DIAGNOSIS — E1121 Type 2 diabetes mellitus with diabetic nephropathy: Secondary | ICD-10-CM | POA: Diagnosis not present

## 2017-05-27 DIAGNOSIS — J4520 Mild intermittent asthma, uncomplicated: Secondary | ICD-10-CM | POA: Diagnosis not present

## 2017-05-27 DIAGNOSIS — R413 Other amnesia: Secondary | ICD-10-CM | POA: Diagnosis not present

## 2017-05-30 ENCOUNTER — Other Ambulatory Visit: Payer: Self-pay | Admitting: Internal Medicine

## 2017-05-30 ENCOUNTER — Telehealth: Payer: Self-pay | Admitting: *Deleted

## 2017-05-30 LAB — CUP PACEART INCLINIC DEVICE CHECK
Battery Remaining Longevity: 103 mo
Brady Statistic AP VS Percent: 0 %
Brady Statistic AS VP Percent: 97.3 %
Brady Statistic AS VS Percent: 0.21 %
Brady Statistic RV Percent Paced: 99.79 %
Implantable Lead Implant Date: 20180207
Implantable Lead Model: 3830
Implantable Lead Model: 5076
Lead Channel Impedance Value: 323 Ohm
Lead Channel Impedance Value: 380 Ohm
Lead Channel Impedance Value: 494 Ohm
Lead Channel Pacing Threshold Amplitude: 0.75 V
Lead Channel Pacing Threshold Pulse Width: 0.4 ms
Lead Channel Sensing Intrinsic Amplitude: 6.25 mV
Lead Channel Setting Pacing Amplitude: 2 V
Lead Channel Setting Pacing Pulse Width: 1 ms
Lead Channel Setting Sensing Sensitivity: 2 mV
MDC IDC LEAD IMPLANT DT: 20180207
MDC IDC LEAD LOCATION: 753859
MDC IDC LEAD LOCATION: 753860
MDC IDC MSMT BATTERY VOLTAGE: 3.12 V
MDC IDC MSMT LEADCHNL RA PACING THRESHOLD AMPLITUDE: 1 V
MDC IDC MSMT LEADCHNL RA SENSING INTR AMPL: 7.5 mV
MDC IDC MSMT LEADCHNL RV IMPEDANCE VALUE: 418 Ohm
MDC IDC MSMT LEADCHNL RV PACING THRESHOLD PULSEWIDTH: 1 ms
MDC IDC PG IMPLANT DT: 20180207
MDC IDC SESS DTM: 20180515155110
MDC IDC SET LEADCHNL RV PACING AMPLITUDE: 2.5 V
MDC IDC STAT BRADY AP VP PERCENT: 2.5 %
MDC IDC STAT BRADY RA PERCENT PACED: 2.55 %

## 2017-05-30 NOTE — Telephone Encounter (Signed)
LMOM requesting call back with Carelink monitor SN so that his monitor will transmit automatically in the future.  Gave Device Clinic phone number for return call.

## 2017-05-30 NOTE — Telephone Encounter (Signed)
Britta MccreedyBarbara, CMA, spoke with patient/family and updated monitor SN in Carelink.

## 2017-06-10 DIAGNOSIS — E559 Vitamin D deficiency, unspecified: Secondary | ICD-10-CM | POA: Diagnosis not present

## 2017-06-10 DIAGNOSIS — E538 Deficiency of other specified B group vitamins: Secondary | ICD-10-CM | POA: Diagnosis not present

## 2017-06-10 DIAGNOSIS — G479 Sleep disorder, unspecified: Secondary | ICD-10-CM | POA: Diagnosis not present

## 2017-06-10 DIAGNOSIS — R351 Nocturia: Secondary | ICD-10-CM | POA: Diagnosis not present

## 2017-06-10 DIAGNOSIS — R413 Other amnesia: Secondary | ICD-10-CM | POA: Diagnosis not present

## 2017-06-17 DIAGNOSIS — E782 Mixed hyperlipidemia: Secondary | ICD-10-CM | POA: Diagnosis not present

## 2017-06-17 DIAGNOSIS — I10 Essential (primary) hypertension: Secondary | ICD-10-CM | POA: Diagnosis not present

## 2017-06-17 DIAGNOSIS — I442 Atrioventricular block, complete: Secondary | ICD-10-CM | POA: Diagnosis not present

## 2017-06-17 DIAGNOSIS — R001 Bradycardia, unspecified: Secondary | ICD-10-CM | POA: Diagnosis not present

## 2017-06-17 DIAGNOSIS — R9431 Abnormal electrocardiogram [ECG] [EKG]: Secondary | ICD-10-CM | POA: Diagnosis not present

## 2017-06-17 DIAGNOSIS — I441 Atrioventricular block, second degree: Secondary | ICD-10-CM | POA: Diagnosis not present

## 2017-06-17 DIAGNOSIS — R0602 Shortness of breath: Secondary | ICD-10-CM | POA: Diagnosis not present

## 2017-06-17 DIAGNOSIS — I208 Other forms of angina pectoris: Secondary | ICD-10-CM | POA: Diagnosis not present

## 2017-06-17 DIAGNOSIS — N23 Unspecified renal colic: Secondary | ICD-10-CM | POA: Diagnosis not present

## 2017-06-17 DIAGNOSIS — N182 Chronic kidney disease, stage 2 (mild): Secondary | ICD-10-CM | POA: Diagnosis not present

## 2017-06-23 DIAGNOSIS — H524 Presbyopia: Secondary | ICD-10-CM | POA: Diagnosis not present

## 2017-06-23 DIAGNOSIS — E119 Type 2 diabetes mellitus without complications: Secondary | ICD-10-CM | POA: Diagnosis not present

## 2017-07-23 DIAGNOSIS — R0609 Other forms of dyspnea: Secondary | ICD-10-CM | POA: Diagnosis not present

## 2017-07-23 DIAGNOSIS — J4520 Mild intermittent asthma, uncomplicated: Secondary | ICD-10-CM | POA: Diagnosis not present

## 2017-07-23 DIAGNOSIS — G479 Sleep disorder, unspecified: Secondary | ICD-10-CM | POA: Diagnosis not present

## 2017-08-12 ENCOUNTER — Encounter: Payer: Medicare HMO | Admitting: *Deleted

## 2017-08-19 DIAGNOSIS — E1121 Type 2 diabetes mellitus with diabetic nephropathy: Secondary | ICD-10-CM | POA: Diagnosis not present

## 2017-08-20 ENCOUNTER — Encounter: Payer: Self-pay | Admitting: Cardiology

## 2017-08-25 ENCOUNTER — Ambulatory Visit (INDEPENDENT_AMBULATORY_CARE_PROVIDER_SITE_OTHER): Payer: Medicare HMO | Admitting: *Deleted

## 2017-08-25 DIAGNOSIS — I442 Atrioventricular block, complete: Secondary | ICD-10-CM | POA: Diagnosis not present

## 2017-08-26 DIAGNOSIS — I10 Essential (primary) hypertension: Secondary | ICD-10-CM | POA: Diagnosis not present

## 2017-08-26 DIAGNOSIS — E1121 Type 2 diabetes mellitus with diabetic nephropathy: Secondary | ICD-10-CM | POA: Diagnosis not present

## 2017-08-26 DIAGNOSIS — J4520 Mild intermittent asthma, uncomplicated: Secondary | ICD-10-CM | POA: Diagnosis not present

## 2017-08-26 DIAGNOSIS — I442 Atrioventricular block, complete: Secondary | ICD-10-CM | POA: Diagnosis not present

## 2017-08-26 LAB — CUP PACEART REMOTE DEVICE CHECK
Battery Remaining Longevity: 99 mo
Battery Voltage: 3.04 V
Brady Statistic AP VS Percent: 0 %
Brady Statistic AS VP Percent: 85.95 %
Brady Statistic AS VS Percent: 0.07 %
Implantable Lead Location: 753860
Implantable Lead Model: 5076
Lead Channel Impedance Value: 304 Ohm
Lead Channel Impedance Value: 361 Ohm
Lead Channel Impedance Value: 513 Ohm
Lead Channel Sensing Intrinsic Amplitude: 6.625 mV
Lead Channel Setting Pacing Amplitude: 2 V
Lead Channel Setting Pacing Amplitude: 2.5 V
Lead Channel Setting Pacing Pulse Width: 1 ms
MDC IDC LEAD IMPLANT DT: 20180207
MDC IDC LEAD IMPLANT DT: 20180207
MDC IDC LEAD LOCATION: 753859
MDC IDC MSMT LEADCHNL RA PACING THRESHOLD AMPLITUDE: 0.875 V
MDC IDC MSMT LEADCHNL RA PACING THRESHOLD PULSEWIDTH: 0.4 ms
MDC IDC MSMT LEADCHNL RA SENSING INTR AMPL: 5.875 mV
MDC IDC MSMT LEADCHNL RA SENSING INTR AMPL: 5.875 mV
MDC IDC MSMT LEADCHNL RV IMPEDANCE VALUE: 399 Ohm
MDC IDC MSMT LEADCHNL RV SENSING INTR AMPL: 6.625 mV
MDC IDC PG IMPLANT DT: 20180207
MDC IDC SESS DTM: 20180827164607
MDC IDC SET LEADCHNL RV SENSING SENSITIVITY: 2 mV
MDC IDC STAT BRADY AP VP PERCENT: 13.98 %
MDC IDC STAT BRADY RA PERCENT PACED: 13.97 %
MDC IDC STAT BRADY RV PERCENT PACED: 99.93 %

## 2017-08-26 NOTE — Progress Notes (Signed)
Remote pacemaker transmission.   

## 2017-08-30 DEATH — deceased

## 2017-09-05 ENCOUNTER — Encounter: Payer: Self-pay | Admitting: Cardiology

## 2017-09-06 ENCOUNTER — Other Ambulatory Visit: Payer: Self-pay | Admitting: Physician Assistant

## 2017-09-08 NOTE — Telephone Encounter (Signed)
Refill Request for pt f/u with klein and [facility] . I don't see where Graciela HusbandsKlein prescribed medication.

## 2017-09-09 NOTE — Telephone Encounter (Signed)
OK to refill coreg 12.5 mg BID.

## 2017-09-15 DIAGNOSIS — R413 Other amnesia: Secondary | ICD-10-CM | POA: Diagnosis not present

## 2017-09-15 DIAGNOSIS — G479 Sleep disorder, unspecified: Secondary | ICD-10-CM | POA: Diagnosis not present

## 2017-09-15 DIAGNOSIS — E559 Vitamin D deficiency, unspecified: Secondary | ICD-10-CM | POA: Diagnosis not present

## 2017-10-10 DIAGNOSIS — R69 Illness, unspecified: Secondary | ICD-10-CM | POA: Diagnosis not present

## 2017-11-18 DIAGNOSIS — E1121 Type 2 diabetes mellitus with diabetic nephropathy: Secondary | ICD-10-CM | POA: Diagnosis not present

## 2017-11-24 ENCOUNTER — Ambulatory Visit (INDEPENDENT_AMBULATORY_CARE_PROVIDER_SITE_OTHER): Payer: Medicare HMO | Admitting: *Deleted

## 2017-11-24 ENCOUNTER — Telehealth: Payer: Self-pay | Admitting: Cardiology

## 2017-11-24 DIAGNOSIS — I442 Atrioventricular block, complete: Secondary | ICD-10-CM | POA: Diagnosis not present

## 2017-11-24 NOTE — Telephone Encounter (Signed)
Spoke with pt and reminded pt of remote transmission that is due today. Pt verbalized understanding.   

## 2017-11-24 NOTE — Progress Notes (Signed)
Remote pacemaker transmission.   

## 2017-11-26 DIAGNOSIS — Z5181 Encounter for therapeutic drug level monitoring: Secondary | ICD-10-CM | POA: Diagnosis not present

## 2017-11-26 DIAGNOSIS — N183 Chronic kidney disease, stage 3 (moderate): Secondary | ICD-10-CM | POA: Diagnosis not present

## 2017-11-26 DIAGNOSIS — E7800 Pure hypercholesterolemia, unspecified: Secondary | ICD-10-CM | POA: Diagnosis not present

## 2017-11-26 DIAGNOSIS — I10 Essential (primary) hypertension: Secondary | ICD-10-CM | POA: Diagnosis not present

## 2017-11-26 DIAGNOSIS — E1121 Type 2 diabetes mellitus with diabetic nephropathy: Secondary | ICD-10-CM | POA: Diagnosis not present

## 2017-11-27 LAB — CUP PACEART REMOTE DEVICE CHECK
Battery Remaining Longevity: 94 mo
Battery Voltage: 3.01 V
Brady Statistic AP VP Percent: 26.04 %
Brady Statistic AP VS Percent: 0 %
Brady Statistic AS VP Percent: 73.88 %
Brady Statistic AS VS Percent: 0.07 %
Brady Statistic RA Percent Paced: 26.02 %
Brady Statistic RV Percent Paced: 99.93 %
Date Time Interrogation Session: 20181126180210
Implantable Lead Implant Date: 20180207
Implantable Lead Implant Date: 20180207
Implantable Lead Location: 753859
Implantable Lead Location: 753860
Implantable Lead Model: 3830
Implantable Lead Model: 5076
Implantable Pulse Generator Implant Date: 20180207
Lead Channel Impedance Value: 304 Ohm
Lead Channel Impedance Value: 361 Ohm
Lead Channel Impedance Value: 380 Ohm
Lead Channel Impedance Value: 494 Ohm
Lead Channel Pacing Threshold Amplitude: 0.875 V
Lead Channel Pacing Threshold Pulse Width: 0.4 ms
Lead Channel Sensing Intrinsic Amplitude: 5.625 mV
Lead Channel Sensing Intrinsic Amplitude: 5.625 mV
Lead Channel Sensing Intrinsic Amplitude: 7.125 mV
Lead Channel Sensing Intrinsic Amplitude: 7.125 mV
Lead Channel Setting Pacing Amplitude: 2 V
Lead Channel Setting Pacing Amplitude: 2.5 V
Lead Channel Setting Pacing Pulse Width: 1 ms
Lead Channel Setting Sensing Sensitivity: 2 mV

## 2017-11-28 ENCOUNTER — Encounter: Payer: Self-pay | Admitting: Cardiology

## 2017-12-06 DIAGNOSIS — B029 Zoster without complications: Secondary | ICD-10-CM | POA: Diagnosis not present

## 2017-12-15 DIAGNOSIS — G479 Sleep disorder, unspecified: Secondary | ICD-10-CM | POA: Diagnosis not present

## 2017-12-15 DIAGNOSIS — R413 Other amnesia: Secondary | ICD-10-CM | POA: Diagnosis not present

## 2018-02-23 ENCOUNTER — Ambulatory Visit (INDEPENDENT_AMBULATORY_CARE_PROVIDER_SITE_OTHER): Payer: Medicare HMO | Admitting: *Deleted

## 2018-02-23 ENCOUNTER — Telehealth: Payer: Self-pay | Admitting: Cardiology

## 2018-02-23 DIAGNOSIS — I442 Atrioventricular block, complete: Secondary | ICD-10-CM | POA: Diagnosis not present

## 2018-02-23 NOTE — Telephone Encounter (Signed)
Spoke with pt and reminded pt of remote transmission that is due today. Pt verbalized understanding.   

## 2018-02-23 NOTE — Progress Notes (Signed)
Remote pacemaker transmission.   

## 2018-02-26 ENCOUNTER — Encounter: Payer: Self-pay | Admitting: Cardiology

## 2018-03-03 ENCOUNTER — Ambulatory Visit: Payer: Medicare HMO | Admitting: Internal Medicine

## 2018-03-03 ENCOUNTER — Encounter: Payer: Self-pay | Admitting: Internal Medicine

## 2018-03-03 VITALS — BP 128/60 | HR 60 | Ht 69.5 in | Wt 181.5 lb

## 2018-03-03 DIAGNOSIS — I442 Atrioventricular block, complete: Secondary | ICD-10-CM | POA: Diagnosis not present

## 2018-03-03 DIAGNOSIS — Z959 Presence of cardiac and vascular implant and graft, unspecified: Secondary | ICD-10-CM

## 2018-03-03 NOTE — Patient Instructions (Signed)

## 2018-03-03 NOTE — Progress Notes (Signed)
Patient Care Team: Rafael Bihari, MD as PCP - General (Internal Medicine)   HPI  Fernando [name] is a 83 y.o. male Seen in follow-up for dyspnea associated with complete heart block and a narrow QRS escape for which  he will underwent His bundle pacing 2/18.[name]  Records and Results Reviewed As above   DATE TEST    2/17    Myoview   EF 56` % No ischemia exercised only 3'51" min--Max HR 115  2/17    echo   EF 55-60 % Normal LA and mild RVE and RAE        He remains vastly improved following pacing.  He denies chest pain shortness of breath or peripheral edema.     Past Medical History:  Diagnosis Date  . Asthma   . Complete heart block (HCC) 06/28/2016   a. s/p MDT His Bundle pacemaker 2018  . Diabetes mellitus without complication (HCC)   . Hypercholesteremia   . Hypertension     Past Surgical History:  Procedure Laterality Date  . Left Arm Surgery    . PACEMAKER IMPLANT N/A 02/05/2017   MDT His Bundle dual chamber PPM implanted by Dr Graciela Husbands for [facility]     Current Outpatient Medications  Medication Sig Dispense Refill  . albuterol (PROVENTIL HFA;VENTOLIN HFA) 108 (90 Base) MCG/ACT inhaler Inhale 2 puffs into the lungs every 6 (six) hours as needed for wheezing or shortness of breath.    [name] amLODipine (NORVASC) 10 MG tablet Take 10 mg by mouth 2 (two) times daily.    [name] aspirin EC 81 MG tablet Take 81 mg by mouth daily.    . benazepril (LOTENSIN) 10 MG tablet Take 20 mg by mouth daily.     . carvedilol (COREG) 12.5 MG tablet TAKE 1 TABLET (12.5 MG TOTAL) BY MOUTH 2 (TWO) TIMES DAILY WITH A MEAL. 60 tablet 6  . chlorthalidone (HYGROTON) 25 MG tablet Take 25 mg by mouth daily.    [name] donepezil (ARICEPT) 10 MG tablet Take 10 mg by mouth daily.    . Fluticasone-Salmeterol (ADVAIR) 250-50 MCG/DOSE AEPB Inhale 1 puff into the lungs 2 (two) times daily.    [name] glipiZIDE (GLUCOTROL XL) 10 MG 24 hr tablet Take 10 mg by mouth daily with breakfast.    . simvastatin (ZOCOR) 40 MG  tablet Take 40 mg by mouth at bedtime.    . traZODone (DESYREL) 50 MG tablet Take 25 mg by mouth daily.  1   No current facility-administered medications for this visit.     Allergies  Allergen Reactions  . Other Anaphylaxis  . Shellfish Allergy Anaphylaxis      Review of Systems negative except from HPI and PMH  Physical Exam BP 128/60 (BP Location: Left Arm, Patient Position: Sitting, Cuff Size: Normal)   Pulse 60   Ht 5' 9.5" (1.765 m)   Wt 181 lb 8 oz (82.3 kg)   BMI 26.42 kg/m  Well developed and nourished in no acute distress HENT normal Neck supple with JVP-flat Clear Regular rate and rhythm, no murmurs or gallops Abd-soft with active BS No Clubbing cyanosis edema Skin-warm and dry A & Oriented  Grossly normal sensory and motor function   ECG demonstrates dual-chamber pacing  Assessment and  Plan  High-grade heart block 2:1   Pacemaker-His bundle   Hypertension  Diabetes    He is doing very well.  He has no significant complaints.  Wife also says he is  doing terrific.  Blood pressure at home is well controlled.

## 2018-03-10 LAB — CUP PACEART REMOTE DEVICE CHECK
Battery Remaining Longevity: 91 mo
Brady Statistic AP VP Percent: 24.59 %
Brady Statistic AP VS Percent: 0 %
Brady Statistic AS VP Percent: 75.31 %
Brady Statistic AS VS Percent: 0.1 %
Brady Statistic RV Percent Paced: 99.9 %
Implantable Lead Implant Date: 20180207
Implantable Lead Model: 3830
Implantable Lead Model: 5076
Lead Channel Impedance Value: 304 Ohm
Lead Channel Impedance Value: 361 Ohm
Lead Channel Impedance Value: 380 Ohm
Lead Channel Pacing Threshold Amplitude: 1 V
Lead Channel Sensing Intrinsic Amplitude: 3.125 mV
Lead Channel Sensing Intrinsic Amplitude: 5.5 mV
Lead Channel Setting Pacing Amplitude: 2 V
Lead Channel Setting Pacing Pulse Width: 1 ms
Lead Channel Setting Sensing Sensitivity: 2 mV
MDC IDC LEAD IMPLANT DT: 20180207
MDC IDC LEAD LOCATION: 753859
MDC IDC LEAD LOCATION: 753860
MDC IDC MSMT BATTERY VOLTAGE: 3 V
MDC IDC MSMT LEADCHNL RA IMPEDANCE VALUE: 589 Ohm
MDC IDC MSMT LEADCHNL RA PACING THRESHOLD PULSEWIDTH: 0.4 ms
MDC IDC MSMT LEADCHNL RA SENSING INTR AMPL: 5.5 mV
MDC IDC MSMT LEADCHNL RV SENSING INTR AMPL: 3.125 mV
MDC IDC PG IMPLANT DT: 20180207
MDC IDC SESS DTM: 20190225184341
MDC IDC SET LEADCHNL RV PACING AMPLITUDE: 2.5 V
MDC IDC STAT BRADY RA PERCENT PACED: 24.59 %

## 2018-03-10 LAB — CUP PACEART INCLINIC DEVICE CHECK
Date Time Interrogation Session: 20190312133045
Implantable Lead Implant Date: 20180207
Implantable Lead Location: 753859
Implantable Lead Model: 5076
MDC IDC LEAD IMPLANT DT: 20180207
MDC IDC LEAD LOCATION: 753860
MDC IDC PG IMPLANT DT: 20180207

## 2018-04-27 IMAGING — CR DG CHEST 2V
2 series · 2 of 2 positions shown · non-contrast
Comparison: 01/30/2016

CLINICAL DATA: Pacemaker insertion.

EXAM:
CHEST  2 VIEW

[chest pa]
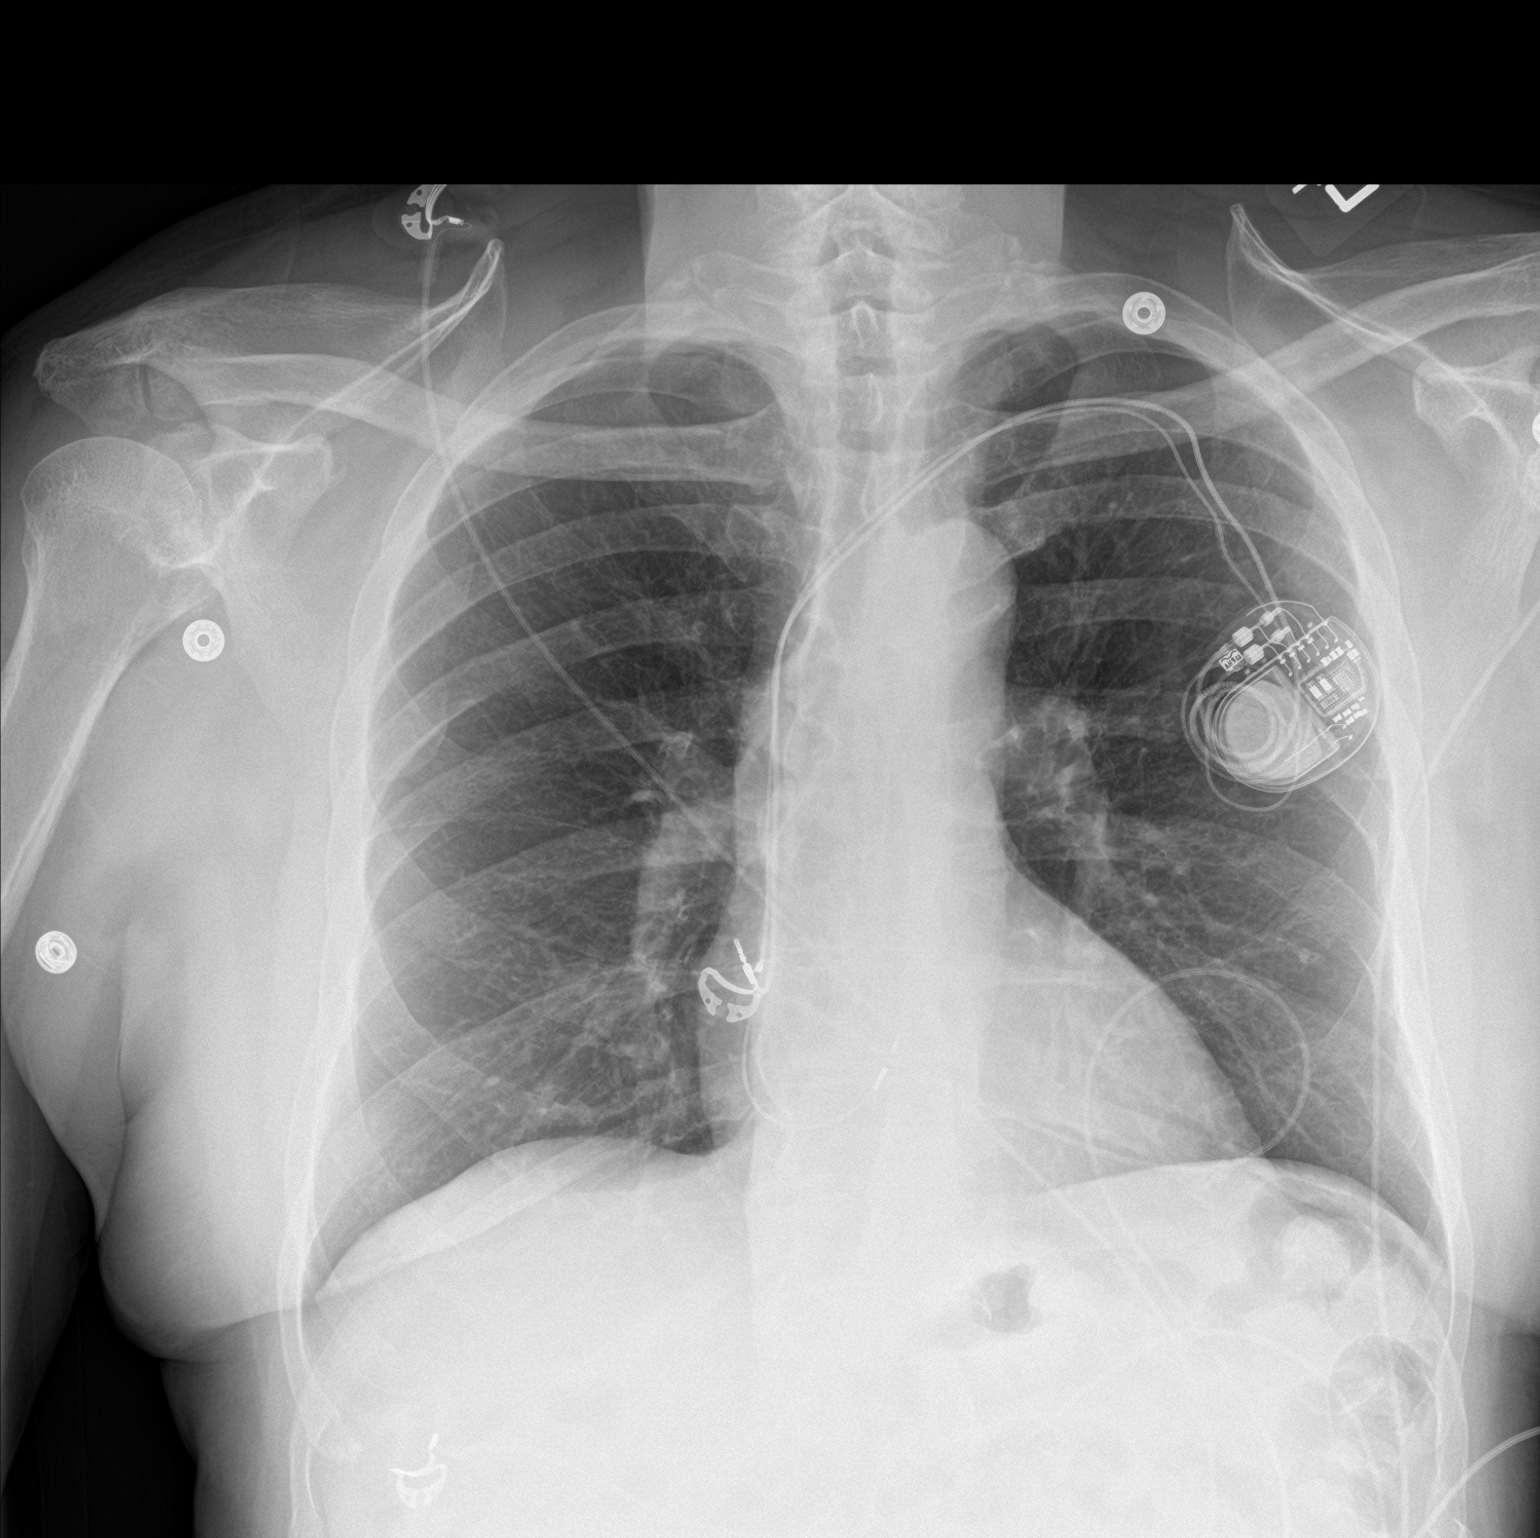

[chest lat]
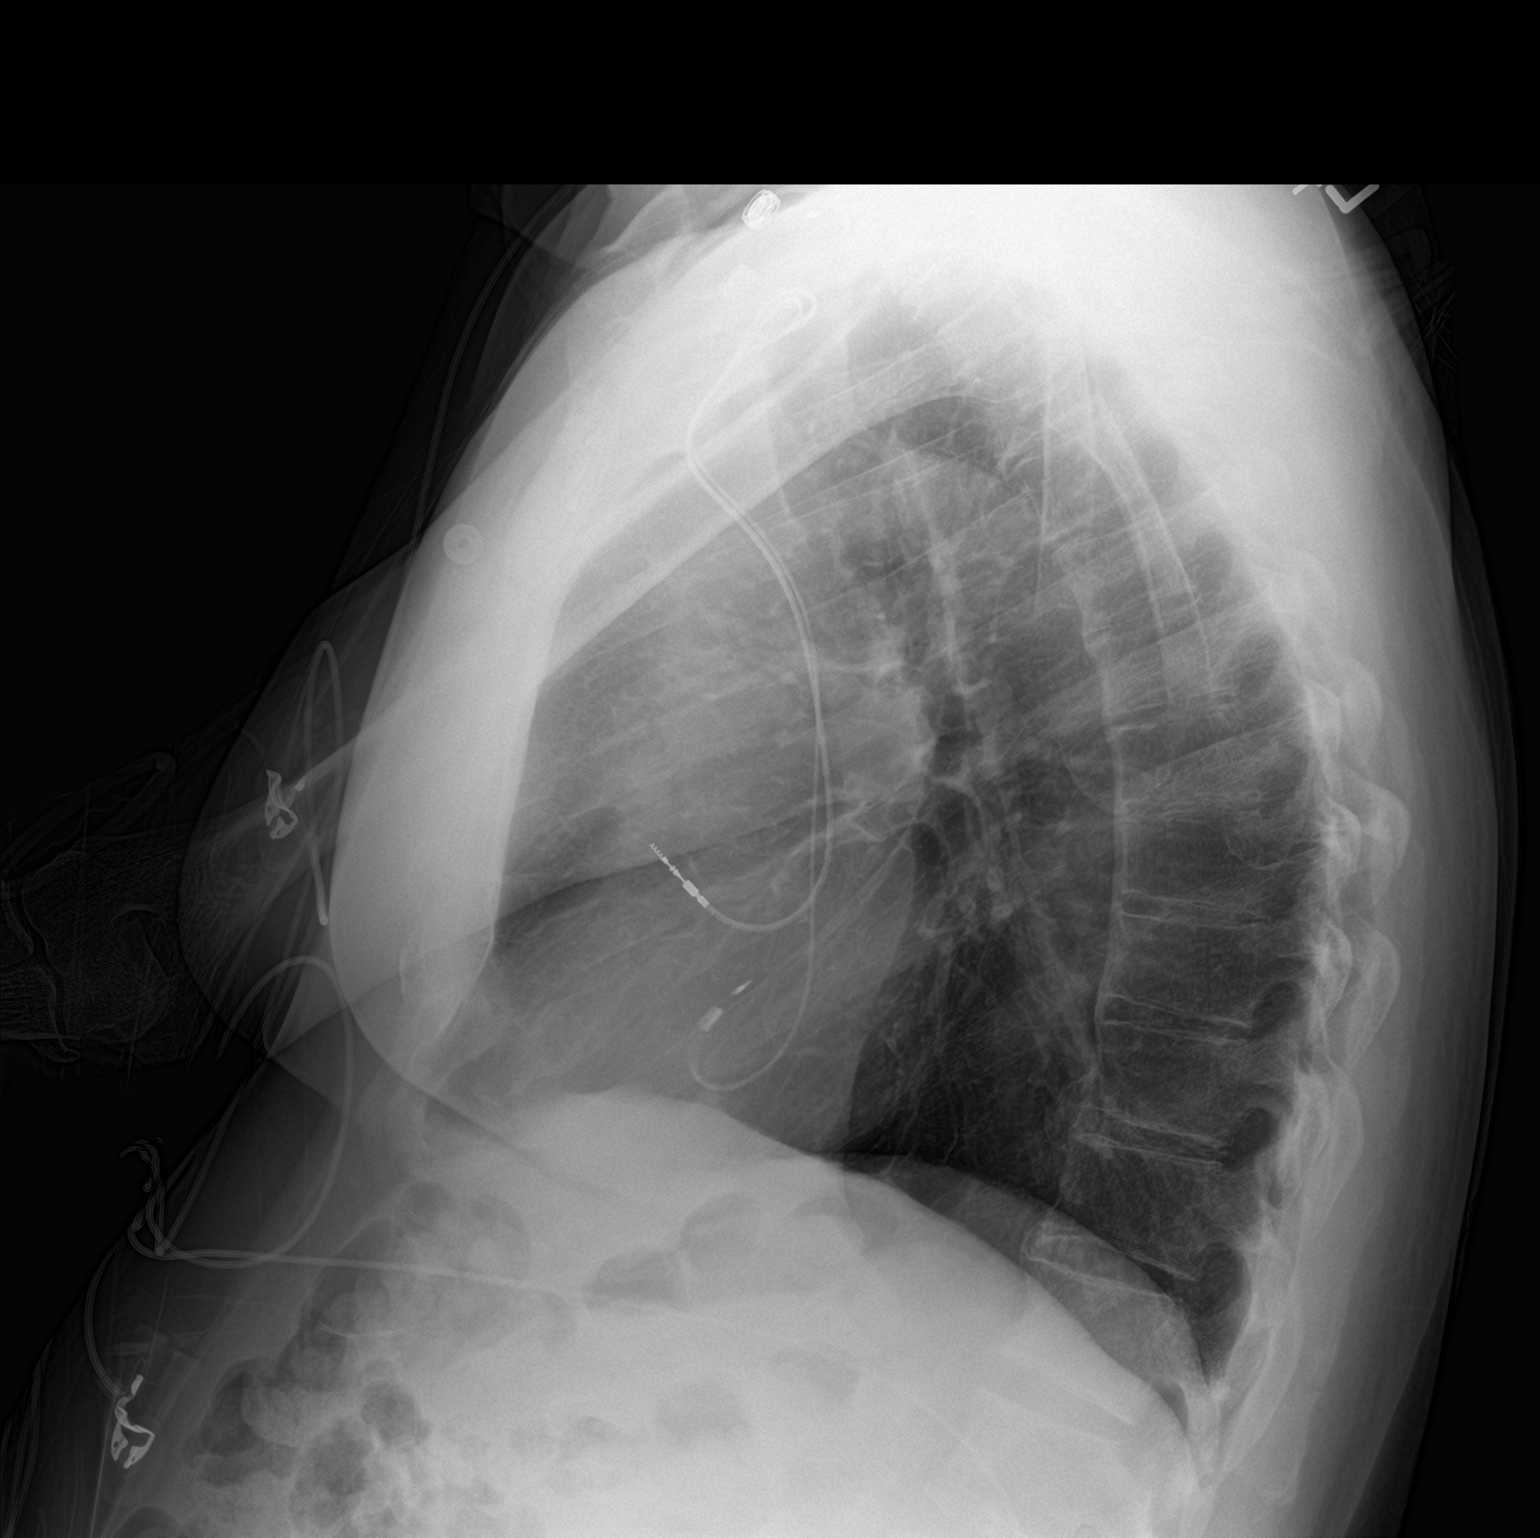

[2 of 2 positions shown; findings below may reference images not displayed]

FINDINGS: New dual-chamber pacer from the left. Right atrial lead overlaps the
appendage. The right ventricular lead is somewhat basal in location
but does overlap the right heart. Stable normal heart size. No
pneumothorax or edema.

Spondylosis with multi-level ankylosis.
IMPRESSION: 1. No acute finding after pacer insertion.
2. Basal location of the right ventricular lead, correlate with
procedural fluoroscopy.

## 2018-05-08 ENCOUNTER — Other Ambulatory Visit: Payer: Self-pay | Admitting: *Deleted

## 2018-05-08 MED ORDER — CARVEDILOL 12.5 MG PO TABS: 12.5000 mg | ORAL_TABLET | Freq: Two times a day (BID) | ORAL | 3 refills | Status: DC

## 2018-05-26 ENCOUNTER — Ambulatory Visit (INDEPENDENT_AMBULATORY_CARE_PROVIDER_SITE_OTHER): Payer: Medicare HMO | Admitting: *Deleted

## 2018-05-26 DIAGNOSIS — I442 Atrioventricular block, complete: Secondary | ICD-10-CM

## 2018-05-26 NOTE — Progress Notes (Signed)
Remote pacemaker transmission.   

## 2018-05-28 LAB — CUP PACEART REMOTE DEVICE CHECK
Battery Voltage: 3 V
Brady Statistic AP VP Percent: 49.23 %
Brady Statistic AS VP Percent: 50.42 %
Brady Statistic RA Percent Paced: 49.39 %
Date Time Interrogation Session: 20190528110220
Implantable Lead Implant Date: 20180207
Implantable Lead Implant Date: 20180207
Implantable Lead Location: 753859
Implantable Lead Model: 3830
Implantable Pulse Generator Implant Date: 20180207
Lead Channel Impedance Value: 304 Ohm
Lead Channel Impedance Value: 399 Ohm
Lead Channel Pacing Threshold Pulse Width: 0.4 ms
Lead Channel Sensing Intrinsic Amplitude: 5.5 mV
Lead Channel Sensing Intrinsic Amplitude: 5.5 mV
Lead Channel Setting Pacing Amplitude: 2 V
Lead Channel Setting Pacing Pulse Width: 1 ms
Lead Channel Setting Sensing Sensitivity: 2 mV
MDC IDC LEAD LOCATION: 753860
MDC IDC MSMT BATTERY REMAINING LONGEVITY: 89 mo
MDC IDC MSMT LEADCHNL RA IMPEDANCE VALUE: 342 Ohm
MDC IDC MSMT LEADCHNL RA IMPEDANCE VALUE: 475 Ohm
MDC IDC MSMT LEADCHNL RA PACING THRESHOLD AMPLITUDE: 0.75 V
MDC IDC MSMT LEADCHNL RV SENSING INTR AMPL: 7.5 mV
MDC IDC MSMT LEADCHNL RV SENSING INTR AMPL: 7.5 mV
MDC IDC SET LEADCHNL RV PACING AMPLITUDE: 2.5 V
MDC IDC STAT BRADY AP VS PERCENT: 0 %
MDC IDC STAT BRADY AS VS PERCENT: 0.34 %
MDC IDC STAT BRADY RV PERCENT PACED: 99.66 %

## 2018-05-29 ENCOUNTER — Encounter: Payer: Self-pay | Admitting: Cardiology

## 2018-06-04 ENCOUNTER — Other Ambulatory Visit: Payer: Self-pay | Admitting: Internal Medicine

## 2018-07-03 ENCOUNTER — Telehealth: Payer: Self-pay | Admitting: Cardiology

## 2018-07-03 NOTE — Telephone Encounter (Signed)
Spoke w/ pt and confirmed that [facility]

## 2020-05-31 ENCOUNTER — Other Ambulatory Visit: Payer: Self-pay

## 2020-05-31 ENCOUNTER — Ambulatory Visit: Payer: Medicare HMO | Admitting: Dermatology

## 2020-05-31 ENCOUNTER — Encounter: Payer: Self-pay | Admitting: Dermatology

## 2020-05-31 DIAGNOSIS — Z8709 Personal history of other diseases of the respiratory system: Secondary | ICD-10-CM

## 2020-05-31 DIAGNOSIS — L280 Lichen simplex chronicus: Secondary | ICD-10-CM

## 2020-05-31 DIAGNOSIS — L209 Atopic dermatitis, unspecified: Secondary | ICD-10-CM | POA: Diagnosis not present

## 2020-05-31 MED ORDER — CLOBETASOL PROPIONATE 0.05 % EX CREA: TOPICAL_CREAM | CUTANEOUS | 2 refills | Status: DC

## 2020-05-31 MED ORDER — TRIAMCINOLONE ACETONIDE 0.1 % EX CREA: TOPICAL_CREAM | CUTANEOUS | 2 refills | Status: DC

## 2020-05-31 NOTE — Progress Notes (Signed)
   New Patient Visit  Subjective  Fernando [name] is a 85 y.o. male who presents for the following: New Patient (Initial Visit) (Rash, B/l elbows, backs of knees, ankle back, itchy and has been there for past 2 years, used prednisone and cortisone that did not help and some other OTC medications.).  Scratches at spots all the time.  Patient has long history of asthma.   The following portions of the chart were reviewed this encounter and updated as appropriate:      Review of Systems:  No other skin or systemic complaints except as noted in HPI or Assessment and Plan.  Objective  Well appearing patient in no apparent distress; mood and affect are within normal limits.  A focused examination was performed including B/L Elbows, knees, ankles and the back. Relevant physical exam findings are noted in the Assessment and Plan.  Objective  Back, lower back, b/l elbows: Hyperpigmented scaly patches, buttocks and lower back  Objective  B/L Knee, Left Calf, Right Upper Calf: Hyperpigmented scaly lichenified patches, b/l knees and left calf, right upper calf   Assessment & Plan  Atopic dermatitis, unspecified type Back, lower back, b/l elbows  Start TMC 0.1 % cream to affected area on back, buttocks and elbow once to twice daily are needed.  Avoid applying to face, groin, and axilla. Use as directed. Risk of skin atrophy with long-term use reviewed.   Recommend mild soap and moisturizing cream 1-2 times daily.  Samples given.  May consider Dupixent if not improving on f/up  Topical steroids (such as triamcinolone, fluocinolone, fluocinonide, mometasone, clobetasol, halobetasol, betamethasone, hydrocortisone) can cause thinning and lightening of the skin if they are used for too long in the same area. Your physician has selected the right strength medicine for your problem and area affected on the body. Please use your medication only as directed by your physician to prevent side  effects.   Ordered Medications: triamcinolone cream (KENALOG) 0.1 %  Lichen simplex chronicus B/L Knee, Left Calf, Right Upper Calf  Start Clobetasol apply to affected area on Legs and knees for thick scaly and bumpy areas once to twice daily as needed.  Stop rubbing and scratching.  Avoid applying to face, groin, and axilla. Use as directed. Risk of skin atrophy with long-term use reviewed.   Topical steroids (such as triamcinolone, fluocinolone, fluocinonide, mometasone, clobetasol, halobetasol, betamethasone, hydrocortisone) can cause thinning and lightening of the skin if they are used for too long in the same area. Your physician has selected the right strength medicine for your problem and area affected on the body. Please use your medication only as directed by your physician to prevent side effects.    clobetasol cream (TEMOVATE) 0.05 % - B/L Knee, Left Calf, Right Upper Calf  triamcinolone cream (KENALOG) 0.1 % - B/L Knee, Left Calf, Right Upper Calf  Return in about 2 months (around 07/31/2020) for Atopic Derm.  Allen Norris, CMA, am acting as scribe for Willeen Niece, MD .  Documentation: I have reviewed the above documentation for accuracy and completeness, and I agree with the above.  Willeen Niece MD

## 2020-05-31 NOTE — Patient Instructions (Addendum)
Recommend daily broad spectrum sunscreen SPF 30+ to sun-exposed areas, reapply every 2 hours as needed. Call for new or changing lesions. °Gentle Skin Care Guide ° °1. Bathe no more than once a day. ° °2. Avoid bathing in hot water ° °3. Use a mild soap like Dove, Vanicream, Cetaphil, CeraVe. Can use Lever 2000 or Cetaphil antibacterial soap ° °4. Use soap only where you need it. On most days, use it under your arms, between your legs, and on your feet. Let the water rinse other areas unless visibly dirty. ° °5. When you get out of the bath/shower, use a towel to gently blot your skin dry, don't rub it. ° °6. While your skin is still a little damp, apply a moisturizing cream such as Vanicream, CeraVe, Cetaphil, Eucerin, Sarna lotion or plain Vaseline Jelly. For hands apply Neutrogena Norwegian Hand Cream or Excipial Hand Cream. ° °7. Reapply moisturizer any time you start to itch or feel dry. ° °8. Sometimes using free and clear laundry detergents can be helpful. Fabric softener sheets should be avoided. Downy Free & Gentle liquid, or any liquid fabric softener that is free of dyes and perfumes, it acceptable to use ° °9. If your doctor has given you prescription creams you may apply moisturizers over them  ° °Topical steroids (such as triamcinolone, fluocinolone, fluocinonide, mometasone, clobetasol, halobetasol, betamethasone, hydrocortisone) can cause thinning and lightening of the skin if they are used for too long in the same area. Your physician has selected the right strength medicine for your problem and area affected on the body. Please use your medication only as directed by your physician to prevent side effects.  °. °Avoid applying to face, groin, and axilla. Use as directed. Risk of skin atrophy with long-term use reviewed.  ° ° °

## 2020-07-11 ENCOUNTER — Other Ambulatory Visit: Payer: Self-pay | Admitting: Internal Medicine

## 2020-07-11 DIAGNOSIS — R634 Abnormal weight loss: Secondary | ICD-10-CM

## 2020-07-24 ENCOUNTER — Ambulatory Visit: Payer: Medicare HMO

## 2020-07-31 ENCOUNTER — Ambulatory Visit: Payer: Medicare HMO | Admitting: Dermatology

## 2020-08-01 ENCOUNTER — Encounter: Payer: Self-pay | Admitting: Intensive Care

## 2020-08-01 ENCOUNTER — Other Ambulatory Visit: Payer: Self-pay

## 2020-08-01 ENCOUNTER — Emergency Department
Admission: EM | Admit: 2020-08-01 | Discharge: 2020-08-01 | Disposition: A | Discharge status: Home / Self Care | Payer: Medicare HMO | Attending: Emergency Medicine | Admitting: Emergency Medicine

## 2020-08-01 ENCOUNTER — Ambulatory Visit: Payer: Medicare HMO

## 2020-08-01 DIAGNOSIS — N1831 Chronic kidney disease, stage 3a: Secondary | ICD-10-CM | POA: Diagnosis not present

## 2020-08-01 DIAGNOSIS — Z7951 Long term (current) use of inhaled steroids: Secondary | ICD-10-CM | POA: Diagnosis not present

## 2020-08-01 DIAGNOSIS — R531 Weakness: Secondary | ICD-10-CM | POA: Insufficient documentation

## 2020-08-01 DIAGNOSIS — Z7984 Long term (current) use of oral hypoglycemic drugs: Secondary | ICD-10-CM | POA: Insufficient documentation

## 2020-08-01 DIAGNOSIS — Z7982 Long term (current) use of aspirin: Secondary | ICD-10-CM | POA: Insufficient documentation

## 2020-08-01 DIAGNOSIS — E876 Hypokalemia: Secondary | ICD-10-CM | POA: Diagnosis not present

## 2020-08-01 DIAGNOSIS — J45909 Unspecified asthma, uncomplicated: Secondary | ICD-10-CM | POA: Insufficient documentation

## 2020-08-01 DIAGNOSIS — E1122 Type 2 diabetes mellitus with diabetic chronic kidney disease: Secondary | ICD-10-CM | POA: Insufficient documentation

## 2020-08-01 DIAGNOSIS — I129 Hypertensive chronic kidney disease with stage 1 through stage 4 chronic kidney disease, or unspecified chronic kidney disease: Secondary | ICD-10-CM | POA: Insufficient documentation

## 2020-08-01 DIAGNOSIS — Z950 Presence of cardiac pacemaker: Secondary | ICD-10-CM | POA: Diagnosis not present

## 2020-08-01 DIAGNOSIS — Z79899 Other long term (current) drug therapy: Secondary | ICD-10-CM | POA: Insufficient documentation

## 2020-08-01 LAB — URINALYSIS, COMPLETE (UACMP) WITH MICROSCOPIC
Bacteria, UA: NONE SEEN
Bilirubin Urine: NEGATIVE
Glucose, UA: NEGATIVE mg/dL
Hgb urine dipstick: NEGATIVE
Ketones, ur: NEGATIVE mg/dL
Leukocytes,Ua: NEGATIVE
Nitrite: NEGATIVE
Protein, ur: NEGATIVE mg/dL
Specific Gravity, Urine: 1.02 (ref 1.005–1.030)
Squamous Epithelial / LPF: NONE SEEN (ref 0–5)
pH: 5 (ref 5.0–8.0)

## 2020-08-01 LAB — CBC
HCT: 40.3 % (ref 39.0–52.0)
Hemoglobin: 14.4 g/dL (ref 13.0–17.0)
MCH: 30.1 pg (ref 26.0–34.0)
MCHC: 35.7 g/dL (ref 30.0–36.0)
MCV: 84.1 fL (ref 80.0–100.0)
Platelets: 234 10*3/uL (ref 150–400)
RBC: 4.79 MIL/uL (ref 4.22–5.81)
RDW: 12.4 % (ref 11.5–15.5)
WBC: 8.2 10*3/uL (ref 4.0–10.5)
nRBC: 0 % (ref 0.0–0.2)

## 2020-08-01 LAB — BASIC METABOLIC PANEL
Anion gap: 12 (ref 5–15)
BUN: 30 mg/dL — ABNORMAL HIGH (ref 8–23)
CO2: 29 mmol/L (ref 22–32)
Calcium: 9.5 mg/dL (ref 8.9–10.3)
Chloride: 92 mmol/L — ABNORMAL LOW (ref 98–111)
Creatinine, Ser: 1.43 mg/dL — ABNORMAL HIGH (ref 0.61–1.24)
GFR calc Af Amer: 52 mL/min — ABNORMAL LOW (ref >60)
GFR calc non Af Amer: 45 mL/min — ABNORMAL LOW (ref >60)
Glucose, Bld: 214 mg/dL — ABNORMAL HIGH (ref 70–99)
Potassium: 3.2 mmol/L — ABNORMAL LOW (ref 3.5–5.1)
Sodium: 133 mmol/L — ABNORMAL LOW (ref 135–145)

## 2020-08-01 MED ORDER — POTASSIUM CHLORIDE CRYS ER 20 MEQ PO TBCR
40.0000 meq | EXTENDED_RELEASE_TABLET | Freq: Once | ORAL | Status: AC
  Administered 2020-08-01: 40 meq via ORAL
  Filled 2020-08-01: qty 2

## 2020-08-01 NOTE — ED Notes (Signed)
Pt reports unable to provide urine sample at this time

## 2020-08-01 NOTE — ED Triage Notes (Addendum)
Patient presents to ER with c/o weakness, loss of appetite, hot flashes, and intermittent confusion since December 2020. A&O x4 in triage. Denies any recent falls. Wife reports he is diagnosed with memory loss by Dr. Malvin Johns and takes medicines for diagnosis.

## 2020-08-01 NOTE — Discharge Instructions (Signed)
You were seen in the ED because of her generalized weakness and hot flash this morning  We did heart testing and blood work that showed a low potassium level, which could be contributing to your generalized weakness, but otherwise without acute problems.  As we discussed, continue normal care at home, continued to drink at least 1 Ensure per day as well as solid food.  Follow-up with Dr. Letitia Libra as scheduled.  If you develop any worsening weakness, strokelike symptoms, fevers, please return to the ED.

## 2020-08-01 NOTE — ED Provider Notes (Signed)
[facility] Emergency Department Provider Note ____________________________________________   First MD Initiated Contact with Patient 08/01/20 1434     (approximate)  I have reviewed the triage vital signs and the nursing notes.  HISTORY  Chief Complaint Weakness and Hot Flashes   HPI Fernando [name] is a 85 y.o. male who presents to the ED for evaluation of chronic weakness.  Chart review indicates [name]. History of asthma, HTN, memory loss on Aricept, DM on oral agents, HLD.  History of complete heart block with pacemaker in place.    Patient presents to the ED with his wife with complaints of chronic generalized weakness.  Reports over a matter of months that he feels "just so weak" by the end of the day, but reports waking up with more energy in the mornings. Wife reports that patient has decreased appetite over the similar timeframe and not eating as much.  She reports that he drinks 1-2 Ensure drinks per day, will only eat "a few bites" of his breakfast and other meals per day, but he is continuing to eat.  She reports concern that he does not finish all of his meals.  Patient reports that sitting the couch and watching television this morning when he felt a rapid onset sensation of warmth/"hot flash" that resolved after a matter of seconds.  Patient denies any associated symptoms with it, including chest pain, shortness of breath, headache, syncope, dizziness, pain anywhere.  This is not recurred since this happened.  Wife reports he is currently at his behavioral baseline.  Patient denies any complaints at this time, reporting that he "feels good."  Patient is well connected with his PCP and reports having outpatient CT scan in 3 days of his abdomen/pelvis.  Patient denies any abdominal pain, cramping, nausea, vomiting or diarrhea.  Patient and wife report that he occasionally has to take a capful of MiraLAX to have a  bowel movement, last was yesterday.  No diarrhea, hematochezia or melena.  Past Medical History:  Diagnosis Date  . Asthma   . Complete heart block (HCC) 06/28/2016   a. s/p MDT His Bundle pacemaker 2018  . Diabetes mellitus without complication (HCC)   . Hypercholesteremia   . Hypertension     Patient Active Problem List   Diagnosis Date Noted  . Complete heart block (HCC) intermittent narrow QRS escape  06/28/2016  . Mobitz type 2 second degree heart block  2:1 heart block 06/28/2016    Past Surgical History:  Procedure Laterality Date  . Left Arm Surgery    . PACEMAKER IMPLANT N/A 02/05/2017   MDT His Bundle dual chamber PPM implanted by Dr Graciela Husbands for [facility]     Prior to Admission medications   Medication Sig Start Date End Date Taking? Authorizing Provider  albuterol (PROVENTIL HFA;VENTOLIN HFA) 108 (90 Base) MCG/ACT inhaler Inhale 2 puffs into the lungs every 6 (six) hours as needed for wheezing or shortness of breath.    [provider]  amLODipine (NORVASC) 10 MG tablet Take 10 mg by mouth 2 (two) times daily.    [provider]  aspirin EC 81 MG tablet Take 81 mg by mouth daily.    [provider]  benazepril (LOTENSIN) 10 MG tablet Take 20 mg by mouth daily.  06/26/16   [provider]  carvedilol (COREG) 12.5 MG tablet TAKE 1 TABLET (12.5 MG TOTAL) BY MOUTH 2 (TWO) TIMES DAILY WITH A MEAL. 06/04/18  Duke Salvia, MD  chlorthalidone (HYGROTON) 25 MG tablet Take 25 mg by mouth daily.    [provider]  clobetasol cream (TEMOVATE) 0.05 % apply to affected area on Legs and knees for thick scaly and bumpy areas once to twice daily are needed. Avoid, face, groin and underarms 05/31/20   Willeen Niece, MD  donepezil (ARICEPT) 10 MG tablet Take 10 mg by mouth daily. 12/15/17   [provider]  Fluticasone-Salmeterol (ADVAIR) 250-50 MCG/DOSE AEPB Inhale 1 puff into the lungs 2 (two) times daily.    [provider]    glipiZIDE (GLUCOTROL XL) 10 MG 24 hr tablet Take 10 mg by mouth daily with breakfast.    [provider]  memantine (NAMENDA) 10 MG tablet Take by mouth. 12/21/19   [provider]  simvastatin (ZOCOR) 40 MG tablet Take 40 mg by mouth at bedtime.    [provider]  traZODone (DESYREL) 50 MG tablet Take 25 mg by mouth daily. 12/15/17   [provider]  triamcinolone cream (KENALOG) 0.1 % Apply to affected area on back, buttocks and elbow once to twice daily are needed. Avoid, face, groin and underarms 05/31/20   Willeen Niece, MD    Allergies Shellfish allergy  History reviewed. No pertinent family history.  Social History Social History   Tobacco Use  . Smoking status: Never Smoker  . Smokeless tobacco: Never Used  Substance Use Topics  . Alcohol use: No  . Drug use: No    Review of Systems  Constitutional: No fever/chills.  Positive for single episode of hot flash.  Positive for generalized weakness. Eyes: No visual changes. ENT: No sore throat. Cardiovascular: Denies chest pain. Respiratory: Denies shortness of breath. Gastrointestinal: No abdominal pain.  No nausea, no vomiting.  No diarrhea.  No constipation. Genitourinary: Negative for dysuria. Musculoskeletal: Negative for back pain. Skin: Negative for rash. Neurological: Negative for headaches, focal weakness or numbness.   ____________________________________________   PHYSICAL EXAM:  VITAL SIGNS: Vitals:   08/01/20 1226 08/01/20 1642  BP: 122/75 140/84  Pulse: 77 74  Resp: 20 18  Temp: 98.9 F (37.2 C)   SpO2: 98%       Constitutional: Alert and oriented. Well appearing and in no acute distress.  Sitting up in a wheelchair, pleasant and conversational in full sentences.  Making jokes.  Wife at the bedside. Eyes: Conjunctivae are normal. PERRL. EOMI. Head: Atraumatic. Nose: No congestion/rhinnorhea. Mouth/Throat: Mucous membranes are moist.  Oropharynx  non-erythematous. Neck: No stridor. No cervical spine tenderness to palpation. Cardiovascular: Normal rate, regular rhythm. Grossly normal heart sounds.  Good peripheral circulation. Respiratory: Normal respiratory effort.  No retractions. Lungs CTAB. Gastrointestinal: Soft , nondistended, nontender to palpation. No abdominal bruits. No CVA tenderness. Benign abdomen throughout. Musculoskeletal: No lower extremity tenderness nor edema.  No joint effusions. No signs of acute trauma. Neurologic:  Normal speech and language. No gross focal neurologic deficits are appreciated. No gait instability noted. Cranial nerves II through XII intact 5/5 strength and sensation in all 4 extremities Skin:  Skin is warm, dry and intact. No rash noted. Psychiatric: Mood and affect are normal. Speech and behavior are normal.  ____________________________________________   LABS (all labs ordered are listed, but only abnormal results are displayed)  Labs Reviewed  BASIC METABOLIC PANEL - Abnormal; Notable for the following components:      Result Value   Sodium 133 (*)    Potassium 3.2 (*)    Chloride 92 (*)  Glucose, Bld 214 (*)    BUN 30 (*)    Creatinine, Ser 1.43 (*)    GFR calc non Af Amer 45 (*)    GFR calc Af Amer 52 (*)    All other components within normal limits  CBC  URINALYSIS, COMPLETE (UACMP) WITH MICROSCOPIC   ____________________________________________  12 Lead EKG Paced rhythm, rate of 72 bpm, normal axis and appropriate intervals for paced rhythm.  No evidence of acute ischemia, applying Sgarbossa criteria. Similar to previous EKG 2 years ago.   ____________________________________________  RADIOLOGY  ED MD interpretation:   Official radiology report(s): No results found.  ____________________________________________   PROCEDURES and INTERVENTIONS  Procedure(s) performed (including Critical Care):  Procedures  Medications  potassium chloride SA (KLOR-CON) CR  tablet 40 mEq (40 mEq Oral Given 08/01/20 1600)    ____________________________________________   INITIAL IMPRESSION / ASSESSMENT AND PLAN / ED COURSE  85 year old man presents to the ED with his wife for evaluation of chronic generalized weakness and single episode of hot flash this morning, with evidence of mild hypokalemia, otherwise no evidence of acute pathology and amenable to outpatient management and PCP follow-up.  Normal vital signs remainder.  Reassuring exam with a pleasant patient with no complaints, reporting he feels well today without pain or weakness.  No evidence of neurovascular compromise, trauma or any acute derangements.  He has a benign abdomen throughout.  Blood work demonstrates mild hypokalemia, that was repleted orally.  Otherwise no derangements, no signs of worsening renal function, changes in blood counts or leukocytosis to suggest indolent underlying pathology.  Discussed with patient and his wife the utility of CT imaging of his abdomen/pelvis today, when he has been scheduled already as an outpatient 3 days from now, she decision-making yields plan to wait for his outpatient imaging as scheduled and follow-up with his PCP after that.  Patient denies any abdominal pain, vomiting, nausea he is nontender my examination, I do not think this needs to happen today.  Nonischemic EKG at patient's baseline.  Discussed return precautions with patient and his wife, they expressed understanding and agreement.  Patient medically stable for discharge home. ____________________________________________   FINAL CLINICAL IMPRESSION(S) / ED DIAGNOSES  Final diagnoses:  Generalized weakness  Hypokalemia  Stage 3a chronic kidney disease     ED Discharge Orders    None       Kyran Connaughton   Note:  This document was prepared using Dragon voice recognition software and may include unintentional dictation errors.   Delton Prairie, MD 08/01/20 [phone]

## 2020-08-04 ENCOUNTER — Ambulatory Visit
Admission: RE | Admit: 2020-08-04 | Discharge: 2020-08-04 | Disposition: A | Discharge status: Home / Self Care | Payer: Medicare HMO | Source: Ambulatory Visit | Attending: Internal Medicine | Admitting: Internal Medicine

## 2020-08-04 ENCOUNTER — Other Ambulatory Visit: Payer: Self-pay

## 2020-08-04 DIAGNOSIS — R634 Abnormal weight loss: Secondary | ICD-10-CM | POA: Diagnosis not present

## 2020-08-04 MED ORDER — IOHEXOL 300 MG/ML  SOLN
85.0000 mL | Freq: Once | INTRAMUSCULAR | Status: AC | PRN
  Administered 2020-08-04: 85 mL via INTRAVENOUS

## 2020-09-11 ENCOUNTER — Other Ambulatory Visit: Payer: Self-pay

## 2020-09-11 ENCOUNTER — Ambulatory Visit: Payer: Medicare HMO | Admitting: Dermatology

## 2020-09-11 DIAGNOSIS — L209 Atopic dermatitis, unspecified: Secondary | ICD-10-CM

## 2020-09-11 DIAGNOSIS — L280 Lichen simplex chronicus: Secondary | ICD-10-CM | POA: Diagnosis not present

## 2020-09-11 DIAGNOSIS — B029 Zoster without complications: Secondary | ICD-10-CM | POA: Diagnosis not present

## 2020-09-11 NOTE — Patient Instructions (Addendum)
Dry Skin Care  What causes dry skin?  Dry skin is common and results from inadequate moisture in the outer skin layers. Dry skin usually results from the excessive loss of moisture from the skin surface. This occurs due to two major factors: 1. Normally the skin's oil glands deposit a layer of oil on the skin's surface. This layer of oil prevents the loss of moisture from the skin. Exposure to soaps, cleaners, solvents, and disinfectants removes this oily film, allowing water to escape. 2. Water loss from the skin increases when the humidity is low. During winter months we spend a lot of time indoors where the air is heated. Heated air has very low humidity. This also contributes to dry skin.  A tendency for dry skin may accompany such disorders as eczema. Also, as people age, the number of functioning oil glands decreases, and the tendency toward dry skin can be a sensation of skin tightness when emerging from the shower.  How do I manage dry skin?  1. Humidify your environment. This can be accomplished by using a humidifier in your bedroom at night during winter months. 2. Bathing can actually put moisture back into your skin if done right. Take the following steps while bathing to sooth dry skin:  Avoid hot water, which only dries the skin and makes itching worse. Use warm water.  Avoid washcloths or extensive rubbing or scrubbing.  Use mild soaps like unscented Dove, Oil of Olay, Cetaphil, Basis, or CeraVe.  If you take baths rather than showers, rinse off soap residue with clean water before getting out of tub.  Once out of the shower/tub, pat dry gently with a soft towel. Leave your skin damp.  While still damp, apply any medicated ointment/cream you were prescribed to the affected areas. After you apply your medicated ointment/cream, then apply your moisturizer to your whole body.This is the most important step in dry skin care. If this is omitted, your skin will continue to be  dry.  The choice of moisturizer is also very important. In general, lotion will not provider enough moisture to severely dry skin because it is water based. You should use an ointment or cream. Moisturizers should also be unscented. Good choices include Vaseline (plain petrolatum), Aquaphor, Cetaphil, CeraVe, Vanicream, DML Forte, Aveeno moisture, or Eucerin Cream.  Bath oils can be helpful, but do not replace the application of moisturizer after the bath. In addition, they make the tub slippery causing an increased risk for falls. Therefore, we do not recommend their use.  Clobetasol Cream - Apply to more severe, thick areas twice daily until improved. Avoid face, groin, underarms.   Triamcinolone 0.1% Cream - Apply to itchy rash on trunk, arms, legs twice daily until improved. Avoid face, groin, underarms.  Zostrix Cream - May try to spot on back for shingles.  Avoid scratching.

## 2020-09-11 NOTE — Progress Notes (Signed)
   Follow-Up Visit   Subjective  Fernando [name] is a 85 y.o. male who presents for the following: Follow-up.  Last seen 3 months ago. LSC on legs.  Better but still scratching areas on legs.  Eczema on body is improved- No longer itching on body.  Had shingles on L back and area is still really sensitive, sometimes painful.  Was given clobetasol for legs and TMC cream for rest of body.   The following portions of the chart were reviewed this encounter and updated as appropriate:      Review of Systems:  No other skin or systemic complaints except as noted in HPI or Assessment and Plan.  Objective  Well appearing patient in no apparent distress; mood and affect are within normal limits.  A focused examination was performed including legs, back. Relevant physical exam findings are noted in the Assessment and Plan.  Objective  Bilateral calf: Hyperpigmented lichenified patch with scattered scaly papules  Objective  Left Lower Back: Hyperpigmented macules and patches  Objective  Back, Arms: Mild xerosis on lower back; xerosis on elbows and arms.   Assessment & Plan  Lichen simplex chronicus Bilateral calf  Continue Clobetasol Cream qd/bid to more severe areas until improved.  Avoid scratching, otherwise will not clear up.  Recommend mild soap and moisturizing cream 1-2 times daily.  CeraVe Healing Ointment, Aveeno Balm, Eucerin Advanced Repair Cream.   Other Related Medications clobetasol cream (TEMOVATE) 0.05 %  Herpes zoster without complication Left Lower Back  Resolved With PIH and Mild Post Herpetic Neuralgia  Clobetasol Cream - Apply to AA back QD/BID prn itch. Avoid face, groin, axilla.  May try OTC Zostrix Cream prn for pain.    Atopic dermatitis, unspecified type Back, Arms  Improved, no longer itching, but still has dry skin Recommend mild soap and moisturizing cream 1-2 times daily to prevent rash recurrence.  Dove Bodywash in shower, sample  given.  Continue TMC 0.1% Cream qd/bid prn itchy rash on trunk, arms, legs.  Avoid f/g/a  Topical steroids (such as triamcinolone, fluocinolone, fluocinonide, mometasone, clobetasol, halobetasol, betamethasone, hydrocortisone) can cause thinning and lightening of the skin if they are used for too long in the same area. Your physician has selected the right strength medicine for your problem and area affected on the body. Please use your medication only as directed by your physician to prevent side effects.    Other Related Medications triamcinolone cream (KENALOG) 0.1 %  Return if symptoms worsen or fail to improve.   Documentation: I have reviewed the above documentation for accuracy and completeness, and I agree with the above.  Willeen Niece MD

## 2020-10-25 ENCOUNTER — Other Ambulatory Visit: Payer: Self-pay | Admitting: Acute Care

## 2020-10-25 DIAGNOSIS — I639 Cerebral infarction, unspecified: Secondary | ICD-10-CM

## 2020-11-15 ENCOUNTER — Other Ambulatory Visit: Payer: Self-pay

## 2020-11-15 ENCOUNTER — Ambulatory Visit (HOSPITAL_COMMUNITY)
Admission: RE | Admit: 2020-11-15 | Discharge: 2020-11-15 | Disposition: A | Discharge status: Home / Self Care | Payer: Medicare HMO | Source: Ambulatory Visit | Attending: Acute Care | Admitting: Acute Care

## 2020-11-15 DIAGNOSIS — I639 Cerebral infarction, unspecified: Secondary | ICD-10-CM

## 2020-11-15 NOTE — Progress Notes (Signed)
Informed of MRI for today.   Device system confirmed to be MRI conditional, with implant date > 6 weeks ago and no evidence of abandoned or epicardial leads in review of most recent CXR Interrogation from today reviewed, pt is currently AS-VP at 83 bpm Change device settings for MRI to DOO at 90 bpm  Tachy-therapies to off if applicable.  Program device back to pre-MRI settings after completion of exam.  Luane School  11/15/2020 12:25 PM

## 2020-11-15 NOTE — Progress Notes (Signed)
Patient here for MRI brain with pacemaker. [facility] express sent to [facility]- Cardiology PA and [facility]- [employer] Rep. Orders received for  DOO rate of 90

## 2021-01-05 ENCOUNTER — Emergency Department
Admission: EM | Admit: 2021-01-05 | Discharge: 2021-01-05 | Disposition: A | Discharge status: Home / Self Care | Payer: Medicare HMO | Attending: Emergency Medicine | Admitting: Emergency Medicine

## 2021-01-05 ENCOUNTER — Encounter: Payer: Self-pay | Admitting: Emergency Medicine

## 2021-01-05 ENCOUNTER — Other Ambulatory Visit: Payer: Self-pay

## 2021-01-05 DIAGNOSIS — J45909 Unspecified asthma, uncomplicated: Secondary | ICD-10-CM | POA: Diagnosis not present

## 2021-01-05 DIAGNOSIS — Z7951 Long term (current) use of inhaled steroids: Secondary | ICD-10-CM | POA: Insufficient documentation

## 2021-01-05 DIAGNOSIS — B0229 Other postherpetic nervous system involvement: Secondary | ICD-10-CM | POA: Diagnosis not present

## 2021-01-05 DIAGNOSIS — I10 Essential (primary) hypertension: Secondary | ICD-10-CM | POA: Insufficient documentation

## 2021-01-05 DIAGNOSIS — Z7984 Long term (current) use of oral hypoglycemic drugs: Secondary | ICD-10-CM | POA: Insufficient documentation

## 2021-01-05 DIAGNOSIS — Z950 Presence of cardiac pacemaker: Secondary | ICD-10-CM | POA: Insufficient documentation

## 2021-01-05 DIAGNOSIS — E119 Type 2 diabetes mellitus without complications: Secondary | ICD-10-CM | POA: Diagnosis not present

## 2021-01-05 DIAGNOSIS — Z7982 Long term (current) use of aspirin: Secondary | ICD-10-CM | POA: Diagnosis not present

## 2021-01-05 DIAGNOSIS — M546 Pain in thoracic spine: Secondary | ICD-10-CM | POA: Diagnosis present

## 2021-01-05 DIAGNOSIS — Z79899 Other long term (current) drug therapy: Secondary | ICD-10-CM | POA: Diagnosis not present

## 2021-01-05 DIAGNOSIS — R0789 Other chest pain: Secondary | ICD-10-CM | POA: Diagnosis not present

## 2021-01-05 HISTORY — DX: Other amnesia: R41.3

## 2021-01-05 HISTORY — DX: Disorder of kidney and ureter, unspecified: N28.9

## 2021-01-05 NOTE — ED Provider Notes (Signed)
[facility] Emergency Department Provider Note ____________________________________________  Time seen: 1200  I have reviewed the triage vital signs and the nursing notes.  HISTORY  Chief Complaint  Back Pain  HPI Fernando [name] is a 86 y.o. male presents to the ED for evaluation of ongoing left-sided thoracic pain.  Patient was recently diagnosed with postherpetic neuralgia.  He had previously been treated with clobetasol cream by his dermatologist.  We discussed all options for treatment, and were considering gabapentin versus Lidoderm patches.  His wife reports that he recently got a prescription for gabapentin which she had not started for the patient.  She intends start the medication today.  Patient is without any other complaints at this time.  He denies any fever, chills, sweats, chest pain, shortness of breath.   Past Medical History:  Diagnosis Date  . Asthma   . Complete heart block (HCC) 06/28/2016   a. s/p MDT His Bundle pacemaker 2018  . Diabetes mellitus without complication (HCC)   . Hypercholesteremia   . Hypertension   . Memory loss   . Renal disorder     Patient Active Problem List   Diagnosis Date Noted  . Complete heart block (HCC) intermittent narrow QRS escape  06/28/2016  . Mobitz type 2 second degree heart block  2:1 heart block 06/28/2016    Past Surgical History:  Procedure Laterality Date  . Left Arm Surgery    . PACEMAKER IMPLANT N/A 02/05/2017   MDT His Bundle dual chamber PPM implanted by Dr Graciela Husbands for [facility]     Prior to Admission medications   Medication Sig Start Date End Date Taking? Authorizing Provider  albuterol (PROVENTIL HFA;VENTOLIN HFA) 108 (90 Base) MCG/ACT inhaler Inhale 2 puffs into the lungs every 6 (six) hours as needed for wheezing or shortness of breath.    [provider]  amLODipine (NORVASC) 10 MG tablet Take 10 mg by mouth 2 (two) times daily.    [provider]  aspirin EC 81 MG  tablet Take 81 mg by mouth daily.    [provider]  benazepril (LOTENSIN) 10 MG tablet Take 20 mg by mouth daily.  06/26/16   [provider]  carvedilol (COREG) 12.5 MG tablet TAKE 1 TABLET (12.5 MG TOTAL) BY MOUTH 2 (TWO) TIMES DAILY WITH A MEAL. 06/04/18   Duke Salvia, MD  chlorthalidone (HYGROTON) 25 MG tablet Take 25 mg by mouth daily.    [provider]  clobetasol cream (TEMOVATE) 0.05 % apply to affected area on Legs and knees for thick scaly and bumpy areas once to twice daily are needed. Avoid, face, groin and underarms 05/31/20   Willeen Niece, MD  donepezil (ARICEPT) 10 MG tablet Take 10 mg by mouth daily. 12/15/17   [provider]  Fluticasone-Salmeterol (ADVAIR) 250-50 MCG/DOSE AEPB Inhale 1 puff into the lungs 2 (two) times daily.    [provider]  glipiZIDE (GLUCOTROL XL) 10 MG 24 hr tablet Take 10 mg by mouth daily with breakfast.    [provider]  memantine (NAMENDA) 10 MG tablet Take by mouth. 12/21/19   [provider]  simvastatin (ZOCOR) 40 MG tablet Take 40 mg by mouth at bedtime.    [provider]  traZODone (DESYREL) 50 MG tablet Take 25 mg by mouth daily. 12/15/17   [provider]  triamcinolone cream (KENALOG) 0.1 % Apply to affected area on back, buttocks and elbow once to twice daily are needed. Avoid, face, groin  and underarms 05/31/20   Willeen Niece, MD    Allergies Shellfish allergy  History reviewed. No pertinent family history.  Social History Social History   Tobacco Use  . Smoking status: Never Smoker  . Smokeless tobacco: Never Used  Substance Use Topics  . Alcohol use: No  . Drug use: No    Review of Systems  Constitutional: Negative for fever. Eyes: Negative for visual changes. ENT: Negative for sore throat. Cardiovascular: Negative for chest pain. Respiratory: Negative for shortness of breath. Gastrointestinal: Negative for abdominal pain, vomiting and  diarrhea. Genitourinary: Negative for dysuria. Musculoskeletal: Negative for back pain. Skin: Negative for rash.  Left thoracic pain as above. Neurological: Negative for headaches, focal weakness or numbness. ____________________________________________  PHYSICAL EXAM:  VITAL SIGNS: ED Triage Vitals  Enc Vitals Group     BP 01/05/21 1027 139/61     Pulse Rate 01/05/21 1027 70     Resp 01/05/21 1027 16     Temp 01/05/21 1027 97.8 F (36.6 C)     Temp Source 01/05/21 1253 Oral     SpO2 01/05/21 1027 97 %     Weight 01/05/21 1126 162 lb (73.5 kg)     Height 01/05/21 1126 5\' 9"  (1.753 m)     Head Circumference --      Peak Flow --      Pain Score 01/05/21 1126 0     Pain Loc --      Pain Edu? --      Excl. in GC? --     Constitutional: Alert and oriented. Well appearing and in no distress. Head: Normocephalic and atraumatic. Cardiovascular: Normal rate, regular rhythm. Normal distal pulses. Respiratory: Normal respiratory effort. No wheezes/rales/rhonchi. Gastrointestinal: Soft and nontender. No distention. Musculoskeletal: Nontender with normal range of motion in all extremities.  Neurologic:  Normal speech and language. No gross focal neurologic deficits are appreciated. Skin:  Skin is warm, dry and intact. No rash noted.  Patient with hypertrophic scarring to the left lateral trunk wall consistent with a dermatomal distribution of his previous herpes zoster.  No skin lesions, blisters, vesicles, or scabbing is noted. Psychiatric: Mood and affect are normal. Patient exhibits appropriate insight and judgment. ____________________________________________  PROCEDURES  Procedures ____________________________________________  INITIAL IMPRESSION / ASSESSMENT AND PLAN / ED COURSE  Geriatric patient ED evaluation continued lateral chest wall pain related to his postherpetic neuralgia.  Patient will start on a recently written prescription for gabapentin for his pain.  His wife  also has Lidoderm patches at home that are available to use as directed.  They will follow-up with her primary provider if ongoing symptoms may return precautions have been discussed.  Jeromiah Ohalloran was evaluated in Emergency Department on 01/05/2021 for the symptoms described in the history of present illness. He was evaluated in the context of the global COVID-19 pandemic, which necessitated consideration that the patient might be at risk for infection with the SARS-CoV-2 virus that causes COVID-19. Institutional protocols and algorithms that pertain to the evaluation of patients at risk for COVID-19 are in a state of rapid change based on information released by regulatory bodies including the CDC and federal and state organizations. These policies and algorithms were followed during the patient's care in the ED. ____________________________________________  FINAL CLINICAL IMPRESSION(S) / ED DIAGNOSES  Final diagnoses:  Post herpetic neuralgia      03/05/2021, Karmen Stabs, PA-C 01/05/21 1846    03/05/21, MD 01/06/21 1510

## 2021-01-05 NOTE — Discharge Instructions (Addendum)
Start the prescription Gabapentin as prescribed by Dr. Letitia Libra. Consider using the Lidoderm patches as well. Follow-up with Dr. Letitia Libra as needed.

## 2021-01-05 NOTE — ED Triage Notes (Addendum)
Pt to ED via POV with wife who states that pt has been having pain in the left side for several months. Pt has shingles in the area 1 year ago. Pt wife states that pain seems to be getting worse and they would like to get it checked out. Pt has been evaluated by his PCP for the pain as well and diagnosed with post hepatic neuralgia.

## 2021-01-09 ENCOUNTER — Other Ambulatory Visit: Payer: Self-pay | Admitting: Dermatology

## 2021-01-09 DIAGNOSIS — L209 Atopic dermatitis, unspecified: Secondary | ICD-10-CM

## 2021-02-05 ENCOUNTER — Ambulatory Visit: Payer: Medicare HMO | Admitting: Internal Medicine

## 2021-02-05 ENCOUNTER — Encounter: Payer: Self-pay | Admitting: Internal Medicine

## 2021-02-05 ENCOUNTER — Other Ambulatory Visit: Payer: Self-pay

## 2021-02-05 VITALS — BP 140/68 | HR 68 | Ht 68.0 in | Wt 159.0 lb

## 2021-02-05 DIAGNOSIS — Z950 Presence of cardiac pacemaker: Secondary | ICD-10-CM | POA: Diagnosis not present

## 2021-02-05 DIAGNOSIS — I441 Atrioventricular block, second degree: Secondary | ICD-10-CM

## 2021-02-05 NOTE — Progress Notes (Signed)
Patient Care Team: Gracelyn Nurse, MD as PCP - General (Internal Medicine)   HPI  Fernando [name] is a 86 y.o. male Seen in follow-up for dyspnea associated with complete heart block and a narrow QRS escape for which He underwent His bundle pacing 2/18.   Progressive fatigue and dementia  Denies exertionalchest pain and SOB, edema and palps  Significant post Zoster pain for which some medicine was assoc with intolerances   DATE TEST EF   2/17    Myoview   56` % No ischemia exercised only 3'51" min--Max HR 115  2/17  Echo  55-60 % Normal LA and mild RVE and RAE        Date Cr K Hgb  8/21 1.43 3.2 14.4  12/21 1.3 3.7 13.8        Past Medical History:  Diagnosis Date  . Asthma   . Complete heart block (HCC) 06/28/2016   a. s/p MDT His Bundle pacemaker 2018  . Diabetes mellitus without complication (HCC)   . Hypercholesteremia   . Hypertension   . Memory loss   . Renal disorder     Past Surgical History:  Procedure Laterality Date  . Left Arm Surgery    . PACEMAKER IMPLANT N/A 02/05/2017   MDT His Bundle dual chamber PPM implanted by Dr Graciela Husbands for [facility]     Current Outpatient Medications  Medication Sig Dispense Refill  . albuterol (PROVENTIL HFA;VENTOLIN HFA) 108 (90 Base) MCG/ACT inhaler Inhale 2 puffs into the lungs every 6 (six) hours as needed for wheezing or shortness of breath.    [name] amLODipine (NORVASC) 10 MG tablet Take 10 mg by mouth 2 (two) times daily.    [name] aspirin EC 81 MG tablet Take 81 mg by mouth daily.    . benazepril (LOTENSIN) 10 MG tablet Take 20 mg by mouth daily.     . carvedilol (COREG) 12.5 MG tablet TAKE 1 TABLET (12.5 MG TOTAL) BY MOUTH 2 (TWO) TIMES DAILY WITH A MEAL. 60 tablet 3  . chlorthalidone (HYGROTON) 25 MG tablet Take 25 mg by mouth daily.    . clobetasol cream (TEMOVATE) 0.05 % apply to affected area on Legs and knees for thick scaly and bumpy areas once to twice daily are needed. Avoid, face, groin and underarms 60 g 2   . clonazePAM (KLONOPIN) 0.5 MG tablet Take 0.25 mg by mouth in the morning and at bedtime.    . donepezil (ARICEPT) 10 MG tablet Take 10 mg by mouth daily.    . Fluticasone-Salmeterol (ADVAIR) 250-50 MCG/DOSE AEPB Inhale 1 puff into the lungs 2 (two) times daily.    [name] glipiZIDE (GLUCOTROL XL) 10 MG 24 hr tablet Take 10 mg by mouth daily with breakfast.    . memantine (NAMENDA) 10 MG tablet Take by mouth.    . potassium chloride (KLOR-CON) 10 MEQ tablet Take 10 mEq by mouth daily.    . simvastatin (ZOCOR) 40 MG tablet Take 40 mg by mouth at bedtime.    . traZODone (DESYREL) 50 MG tablet Take 25 mg by mouth daily.  1  . triamcinolone (KENALOG) 0.1 % APPLY TO AFFECTED AREA ON BACK, BUTTOCKS AND ELBOW ONCE TO TWICE DAILY ARE NEEDED. AVOID, FACE, GROIN AND UNDERARMS 80 g 2   No current facility-administered medications for this visit.    Allergies  Allergen Reactions  . Shellfish Allergy Anaphylaxis      Review of Systems negative except from HPI and PMH  Physical Exam BP 140/68   Pulse 68   Ht 5\' 8"  (1.727 m)   Wt 159 lb (72.1 kg)   SpO2 99%   BMI 24.18 kg/m  Well developed and well nourished in no acute distress HENT normal Neck supple with JVP-flat Clear Device pocket well healed; without hematoma or erythema.  There is no tethering  Regular rate and rhythm, no murmur Abd-soft with active BS No Clubbing cyanosis  edema Skin-warm and dry A  Grossly normal sensory and motor function  ECG *sinus with P-synchronous/ AV  pacing   Assessment and  Plan  High-grade heart block 2:1   Pacemaker-His bundle   Hypertension  Diabetes  Post Zoster neuralgia   Functional status is worse but difficult to assess causes.  The zoster neuralgia has been limiting and disruptive.  Some med was tried and poorly tolerated but I dont see in Epic  Will reach out to his PCP  Also given the multiple diagnoses and the struggles his wife is having in caring for him, I have recommended they  consider palliative care and will reach out to his PCP  BP well controlled   Consider echo at next visit; with 100% V pacing at risk for pacemaker cardiomyopathy

## 2021-02-05 NOTE — Patient Instructions (Signed)

## 2021-05-15 ENCOUNTER — Emergency Department
Admission: EM | Admit: 2021-05-15 | Discharge: 2021-05-15 | Disposition: A | Discharge status: Home / Self Care | Payer: Medicare HMO | Attending: Emergency Medicine | Admitting: Emergency Medicine

## 2021-05-15 ENCOUNTER — Emergency Department: Payer: Medicare HMO

## 2021-05-15 ENCOUNTER — Other Ambulatory Visit: Payer: Self-pay

## 2021-05-15 DIAGNOSIS — Z7984 Long term (current) use of oral hypoglycemic drugs: Secondary | ICD-10-CM | POA: Diagnosis not present

## 2021-05-15 DIAGNOSIS — Z789 Other specified health status: Secondary | ICD-10-CM

## 2021-05-15 DIAGNOSIS — E119 Type 2 diabetes mellitus without complications: Secondary | ICD-10-CM | POA: Insufficient documentation

## 2021-05-15 DIAGNOSIS — J45909 Unspecified asthma, uncomplicated: Secondary | ICD-10-CM | POA: Diagnosis not present

## 2021-05-15 DIAGNOSIS — E876 Hypokalemia: Secondary | ICD-10-CM | POA: Insufficient documentation

## 2021-05-15 DIAGNOSIS — Z950 Presence of cardiac pacemaker: Secondary | ICD-10-CM | POA: Diagnosis not present

## 2021-05-15 DIAGNOSIS — Z79899 Other long term (current) drug therapy: Secondary | ICD-10-CM | POA: Insufficient documentation

## 2021-05-15 DIAGNOSIS — Z7982 Long term (current) use of aspirin: Secondary | ICD-10-CM | POA: Diagnosis not present

## 2021-05-15 DIAGNOSIS — R9431 Abnormal electrocardiogram [ECG] [EKG]: Secondary | ICD-10-CM | POA: Diagnosis not present

## 2021-05-15 DIAGNOSIS — R5383 Other fatigue: Secondary | ICD-10-CM | POA: Insufficient documentation

## 2021-05-15 DIAGNOSIS — R079 Chest pain, unspecified: Secondary | ICD-10-CM | POA: Insufficient documentation

## 2021-05-15 DIAGNOSIS — I10 Essential (primary) hypertension: Secondary | ICD-10-CM | POA: Diagnosis not present

## 2021-05-15 DIAGNOSIS — F0390 Unspecified dementia without behavioral disturbance: Secondary | ICD-10-CM | POA: Diagnosis not present

## 2021-05-15 LAB — CBC
HCT: 40.9 % (ref 39.0–52.0)
Hemoglobin: 14.6 g/dL (ref 13.0–17.0)
MCH: 29.6 pg (ref 26.0–34.0)
MCHC: 35.7 g/dL (ref 30.0–36.0)
MCV: 83 fL (ref 80.0–100.0)
Platelets: 192 10*3/uL (ref 150–400)
RBC: 4.93 MIL/uL (ref 4.22–5.81)
RDW: 12.3 % (ref 11.5–15.5)
WBC: 6.5 10*3/uL (ref 4.0–10.5)
nRBC: 0 % (ref 0.0–0.2)

## 2021-05-15 LAB — BASIC METABOLIC PANEL
Anion gap: 10 (ref 5–15)
BUN: 23 mg/dL (ref 8–23)
CO2: 28 mmol/L (ref 22–32)
Calcium: 9.8 mg/dL (ref 8.9–10.3)
Chloride: 97 mmol/L — ABNORMAL LOW (ref 98–111)
Creatinine, Ser: 1.25 mg/dL — ABNORMAL HIGH (ref 0.61–1.24)
GFR, Estimated: 57 mL/min — ABNORMAL LOW (ref >60)
Glucose, Bld: 123 mg/dL — ABNORMAL HIGH (ref 70–99)
Potassium: 3.4 mmol/L — ABNORMAL LOW (ref 3.5–5.1)
Sodium: 135 mmol/L (ref 135–145)

## 2021-05-15 LAB — TROPONIN I (HIGH SENSITIVITY)
Troponin I (High Sensitivity): 9 ng/L (ref <18)
Troponin I (High Sensitivity): 11 ng/L (ref <18)

## 2021-05-15 LAB — MAGNESIUM: Magnesium: 1.7 mg/dL (ref 1.7–2.4)

## 2021-05-15 MED ORDER — POTASSIUM CHLORIDE CRYS ER 20 MEQ PO TBCR
40.0000 meq | EXTENDED_RELEASE_TABLET | Freq: Once | ORAL | Status: AC
  Administered 2021-05-15: 40 meq via ORAL
  Filled 2021-05-15: qty 2

## 2021-05-15 NOTE — ED Triage Notes (Signed)
Per pt wife, pt started c/o having sudden onset chest pain around 1230p today, states it has eased off but still there. Denies SOB or other sx. Pt is a/ox4 at present, in NAD.

## 2021-05-15 NOTE — ED Provider Notes (Signed)
[facility] Emergency Department Provider Note  ____________________________________________   Event Date/Time   First MD Initiated Contact with Patient 05/15/21 1355     (approximate)  I have reviewed the triage vital signs and the nursing notes.   HISTORY  Chief Complaint Chest Pain   HPI Fernando [name] is a 86 y.o. male with a past medical history of asthma, complete heart block status post pacemaker, HTN, HDL, diabetes and some dementia although fairly functional living at home with wife who presents accompanied by his wife for assessment of chest tightness.  Patient also states he tripped yesterday although did not fall or hit his head or injure himself.  He states that today he had 2 episodes of chest tightness first around 1230 and 1 shortly after this.  Both resolved after few minutes without any medications interventions.  He was at rest during both occasions.  He denies any associated shortness of breath, nausea, headache, earache, sore throat, cough, abdominal pain, back pain or any other recent sick symptoms including fevers, urinary symptoms, rash or any other injuries.  He is not on any blood thinners.  No other acute concerns at this time.  His wife thinks he has been more fatigued after the second episode today.         Past Medical History:  Diagnosis Date  . Asthma   . Complete heart block (HCC) 06/28/2016   a. s/p MDT His Bundle pacemaker 2018  . Diabetes mellitus without complication (HCC)   . Hypercholesteremia   . Hypertension   . Memory loss   . Renal disorder     Patient Active Problem List   Diagnosis Date Noted  . Complete heart block (HCC) intermittent narrow QRS escape  06/28/2016  . Mobitz type 2 second degree heart block  2:1 heart block 06/28/2016    Past Surgical History:  Procedure Laterality Date  . Left Arm Surgery    . PACEMAKER IMPLANT N/A 02/05/2017   MDT His Bundle dual chamber PPM implanted by Dr Graciela Husbands  for CHB     Prior to Admission medications   Medication Sig Start Date End Date Taking? Authorizing Provider  albuterol (PROVENTIL HFA;VENTOLIN HFA) 108 (90 Base) MCG/ACT inhaler Inhale 2 puffs into the lungs every 6 (six) hours as needed for wheezing or shortness of breath.    [provider]  amLODipine (NORVASC) 10 MG tablet Take 10 mg by mouth 2 (two) times daily.    [provider]  aspirin EC 81 MG tablet Take 81 mg by mouth daily.    [provider]  benazepril (LOTENSIN) 10 MG tablet Take 20 mg by mouth daily.  06/26/16   [provider]  carvedilol (COREG) 12.5 MG tablet TAKE 1 TABLET (12.5 MG TOTAL) BY MOUTH 2 (TWO) TIMES DAILY WITH A MEAL. 06/04/18   Duke Salvia, MD  chlorthalidone (HYGROTON) 25 MG tablet Take 25 mg by mouth daily.    [provider]  clobetasol cream (TEMOVATE) 0.05 % apply to affected area on Legs and knees for thick scaly and bumpy areas once to twice daily are needed. Avoid, face, groin and underarms 05/31/20   Willeen Niece, MD  clonazePAM (KLONOPIN) 0.5 MG tablet Take 0.25 mg by mouth in the morning and at bedtime. 01/09/21   [provider]  donepezil (ARICEPT) 10 MG tablet Take 10 mg by mouth daily. 12/15/17   [provider]  Fluticasone-Salmeterol (ADVAIR) 250-50 MCG/DOSE AEPB Inhale 1 puff into the  lungs 2 (two) times daily.    [provider]  glipiZIDE (GLUCOTROL XL) 10 MG 24 hr tablet Take 10 mg by mouth daily with breakfast.    [provider]  memantine (NAMENDA) 10 MG tablet Take by mouth. 12/21/19   [provider]  potassium chloride (KLOR-CON) 10 MEQ tablet Take 10 mEq by mouth daily. 11/27/20   [provider]  simvastatin (ZOCOR) 40 MG tablet Take 40 mg by mouth at bedtime.    [provider]  traZODone (DESYREL) 50 MG tablet Take 25 mg by mouth daily. 12/15/17   [provider]  triamcinolone (KENALOG) 0.1 % APPLY TO AFFECTED AREA ON  BACK, BUTTOCKS AND ELBOW ONCE TO TWICE DAILY ARE NEEDED. Carmine Savoy AND UNDERARMS 01/09/21   Willeen Niece, MD    Allergies Shellfish allergy, Gabapentin, and Nortriptyline  No family history on file.  Social History Social History   Tobacco Use  . Smoking status: Never Smoker  . Smokeless tobacco: Never Used  Substance Use Topics  . Alcohol use: No  . Drug use: No    Review of Systems  Review of Systems  Constitutional: Positive for malaise/fatigue. Negative for chills and fever.  HENT: Negative for sore throat.   Eyes: Negative for pain.  Respiratory: Negative for cough and stridor.   Cardiovascular: Positive for chest pain.  Gastrointestinal: Negative for vomiting.  Genitourinary: Negative for dysuria.  Musculoskeletal: Negative for myalgias.  Skin: Negative for rash.  Neurological: Negative for seizures, loss of consciousness and headaches.  Psychiatric/Behavioral: Negative for suicidal ideas.  All other systems reviewed and are negative.     ____________________________________________   PHYSICAL EXAM:  VITAL SIGNS: ED Triage Vitals  Enc Vitals Group     BP 05/15/21 1324 129/90     Pulse Rate 05/15/21 1324 74     Resp 05/15/21 1324 18     Temp 05/15/21 1324 98.3 F (36.8 C)     Temp Source 05/15/21 1324 Oral     SpO2 05/15/21 1324 98 %     Weight 05/15/21 1324 168 lb (76.2 kg)     Height 05/15/21 1324 5\' 9"  (1.753 m)     Head Circumference --      Peak Flow --      Pain Score 05/15/21 1338 0     Pain Loc --      Pain Edu? --      Excl. in GC? --    Vitals:   05/15/21 1600 05/15/21 1630  BP: (!) 165/79 (!) 148/73  Pulse: 68 67  Resp:  20  Temp:    SpO2: 97% 97%   Physical Exam Vitals and nursing note reviewed.  Constitutional:      Appearance: He is well-developed.  HENT:     Head: Normocephalic and atraumatic.     Right Ear: External ear normal.     Left Ear: External ear normal.     Nose: Nose normal.  Eyes:      Conjunctiva/sclera: Conjunctivae normal.  Cardiovascular:     Rate and Rhythm: Normal rate and regular rhythm.     Heart sounds: No murmur heard.   Pulmonary:     Effort: Pulmonary effort is normal. No respiratory distress.     Breath sounds: Normal breath sounds.  Abdominal:     Palpations: Abdomen is soft.     Tenderness: There is no abdominal tenderness.  Musculoskeletal:     Cervical back: Neck supple.  Skin:  General: Skin is warm and dry.     Capillary Refill: Capillary refill takes less than 2 seconds.  Neurological:     Mental Status: He is alert and oriented to person, place, and time.  Psychiatric:        Mood and Affect: Mood normal.     2+ radial pulses.  Cranial nerves II through XII grossly intact.  Patient has full and symmetric strength in his bilateral upper and lower extremities.  No tenderness step-offs or deformities over the C/T/L-spine. ____________________________________________   LABS (all labs ordered are listed, but only abnormal results are displayed)  Labs Reviewed  BASIC METABOLIC PANEL - Abnormal; Notable for the following components:      Result Value   Potassium 3.4 (*)    Chloride 97 (*)    Glucose, Bld 123 (*)    Creatinine, Ser 1.25 (*)    GFR, Estimated 57 (*)    All other components within normal limits  CBC  MAGNESIUM  TROPONIN I (HIGH SENSITIVITY)  TROPONIN I (HIGH SENSITIVITY)   ____________________________________________  EKG  ECG shows atrially sensed ventricular paced rhythm with a rate of 75.  ST changes in inferior lateral leads appear very similar to ECG obtained on 02/05/2021. ____________________________________________  RADIOLOGY  ED MD interpretation: Chest x-ray shows no overt edema, pneumothorax, focal consolidation, pacemaker lead fracture or any other clear acute intrathoracic process.    Official radiology report(s): DG Chest 2 View  Result Date: 05/15/2021 CLINICAL DATA:  Pt states he had a pain go  through his chest this morning but seems to be feeling better.chest pain EXAM: CHEST - 2 VIEW COMPARISON:  02/06/2017 FINDINGS: LEFT-sided pacemaker overlies normal cardiac silhouette. No effusion, infiltrate pneumothorax. Degenerative osteophytosis of the spine. IMPRESSION: No acute cardiopulmonary process. Electronically Signed   By: Genevive Bi M.D.   On: 05/15/2021 14:24    ____________________________________________   PROCEDURES  Procedure(s) performed (including Critical Care):  .1-3 Lead EKG Interpretation Performed by: Gilles Chiquito, MD Authorized by: Gilles Chiquito, MD     Interpretation: abnormal     ECG rate assessment: normal     Rhythm: paced     Ectopy: none       ____________________________________________   INITIAL IMPRESSION / ASSESSMENT AND PLAN / ED COURSE      Patient presents with above-stated history exam for assessment of 2 episodes lasting less than a couple minutes of some chest tightness earlier today as well as some fatigue afterwards.  He also notes he slipped yesterday although it does not sound like he hit his head or injured himself anywhere he has no complaints from this.  He is adamant he did not pass out.  On arrival today he is afebrile hemodynamically stable.  With regard to episodes today primary differential includes ACS, arrhythmia, anemia, metabolic derangements, pericarditis, colitis, and pneumonia.  Overall very low suspicion for PE given shortness of nature of symptoms without any evidence of hypoxia, tachypnea, tachycardia patient denying any shortness of breath without any recent clear risk factors.  In addition he has no findings on exam to suggest a DVT.  Overall I also have a very low suspicion for dissection at this time given overall description of discomfort with symmetric bilateral upper extremity pulses and patient adamantly denying any symptoms at this time.  Chest x-ray has no evidence of pneumonia, thorax, effusion,  edema or any other clear acute intrathoracic process.  Troponins x2 are nonelevated and given no significant changes in EKG  when compared to prior and no chest pain emergency room Evalose patient for ACS at this time.  BMP remarkable for K of 3.4 without any other significant derangements.  This was repleted.  CBC without leukocytosis or acute anemia.  Overall low suspicion for acute infectious process given absence of fever cough or leukocytosis.  Magnesium is unremarkable.  Overall unclear etiology for patient's chest tightness earlier today although given stable vitals with otherwise reassuring exam and work-up I think he is safe for discharge with continued outpatient evaluation.     ____________________________________________   FINAL CLINICAL IMPRESSION(S) / ED DIAGNOSES  Final diagnoses:  Chest pain, unspecified type  Hypokalemia  Paced rhythm on electrocardiogram (ECG)    Medications  potassium chloride SA (KLOR-CON) CR tablet 40 mEq (40 mEq Oral Given 05/15/21 1421)     ED Discharge Orders    None       Note:  This document was prepared using Dragon voice recognition software and may include unintentional dictation errors.   Gilles ChiquitoSmith, Jerryl Holzhauer P, MD 05/15/21 [phone]

## 2021-05-15 NOTE — ED Notes (Signed)
Visitor remains at bedside. Pt offered blanket; declined. Pt denies any needs currently.

## 2021-05-15 NOTE — ED Notes (Signed)
Pt and family verbalizes understanding of d/c instructions, medications and follow up

## 2021-06-16 ENCOUNTER — Other Ambulatory Visit: Payer: Self-pay | Admitting: Dermatology

## 2021-06-16 DIAGNOSIS — L280 Lichen simplex chronicus: Secondary | ICD-10-CM

## 2021-06-19 ENCOUNTER — Other Ambulatory Visit: Payer: Self-pay | Admitting: Dermatology

## 2021-06-19 DIAGNOSIS — L280 Lichen simplex chronicus: Secondary | ICD-10-CM

## 2021-07-11 ENCOUNTER — Other Ambulatory Visit: Payer: Self-pay | Admitting: Internal Medicine

## 2021-07-11 ENCOUNTER — Other Ambulatory Visit (HOSPITAL_COMMUNITY): Payer: Self-pay | Admitting: Internal Medicine

## 2021-07-11 DIAGNOSIS — R1901 Right upper quadrant abdominal swelling, mass and lump: Secondary | ICD-10-CM

## 2021-07-23 ENCOUNTER — Other Ambulatory Visit: Payer: Self-pay

## 2021-07-23 ENCOUNTER — Ambulatory Visit
Admission: RE | Admit: 2021-07-23 | Discharge: 2021-07-23 | Disposition: A | Discharge status: Home / Self Care | Payer: Medicare HMO | Source: Ambulatory Visit | Attending: Internal Medicine | Admitting: Internal Medicine

## 2021-07-23 DIAGNOSIS — R1901 Right upper quadrant abdominal swelling, mass and lump: Secondary | ICD-10-CM | POA: Insufficient documentation

## 2021-07-23 LAB — POCT I-STAT CREATININE: Creatinine, Ser: 1.2 mg/dL (ref 0.61–1.24)

## 2021-07-23 MED ORDER — IOHEXOL 350 MG/ML SOLN
80.0000 mL | Freq: Once | INTRAVENOUS | Status: AC | PRN
  Administered 2021-07-23: 80 mL via INTRAVENOUS

## 2022-02-03 IMAGING — MR MR HEAD W/O CM
14 of 15 series · 45 of 48 positions shown · non-contrast
Comparison: None.

CLINICAL DATA: Shakes in the morning that are concerning for
seizures.

EXAM:
MRI HEAD WITHOUT CONTRAST
TECHNIQUE: Multiplanar, multiecho pulse sequences of the brain and surrounding
structures were obtained without intravenous contrast.

[Series 9: DWI · axial · 3.0mm · 0.88mm/px · z∈[-32,+113]mm · 7 of 100 slices shown (1 of 4)]
[im 1/100]
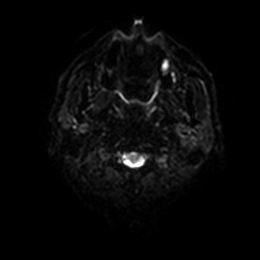
[im 17/100]
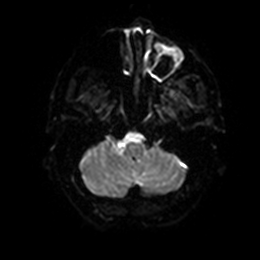
[im 34/100]
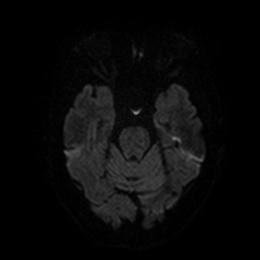
[im 50/100]
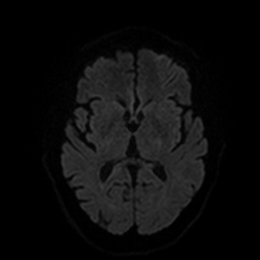
[im 67/100]
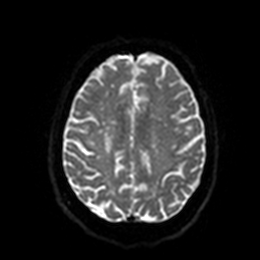
[im 83/100]
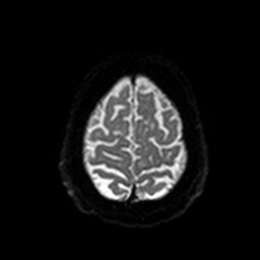
[im 100/100]
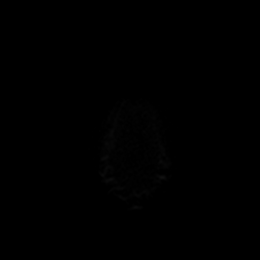

[Series 10: DWI · axial · 3.0mm · 0.88mm/px · z∈[-32,+113]mm · 4 of 49 slices shown (2 of 4)]
[im 1/49]
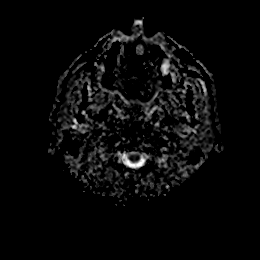
[im 17/49]
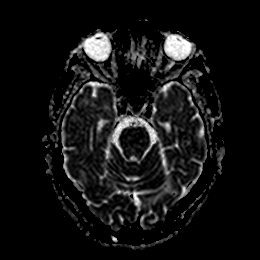
[im 33/49]
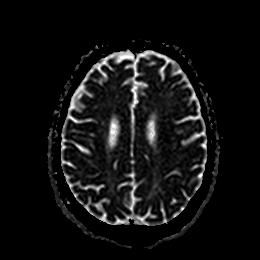
[im 49/49]
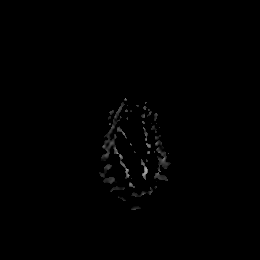

[Series 11: DWI · coronal · 4.0mm · 0.88mm/px · 5 of 76 slices shown (3 of 4)]
[im 1/76]
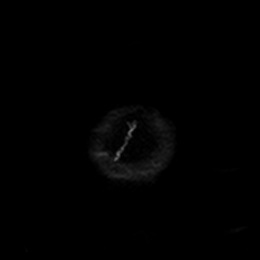
[im 19/76]
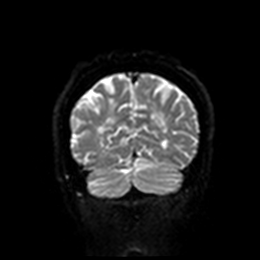
[im 38/76]
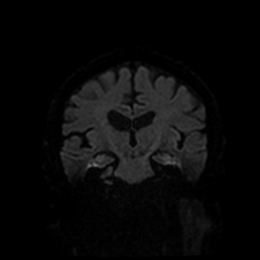
[im 57/76]
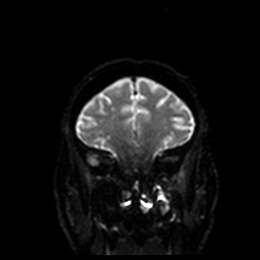
[im 76/76]
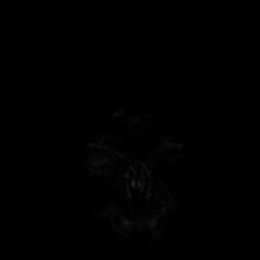

[Series 12: DWI · coronal · 4.0mm · 0.88mm/px · 3 of 38 slices shown (4 of 4)]
[im 1/38]
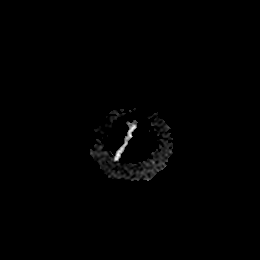
[im 19/38]
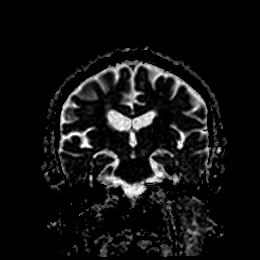
[im 38/38]
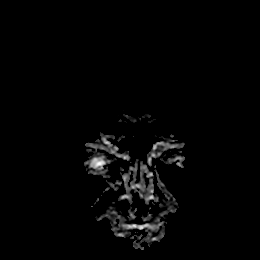

[Series 13: T1 · sagittal · 5.0mm · 0.75mm/px · 2 of 23 slices shown]
[im 1/23]
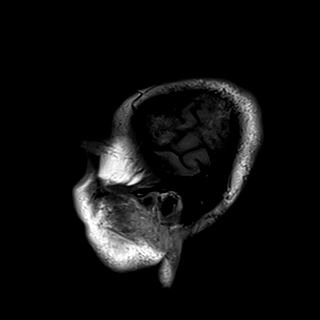
[im 23/23]
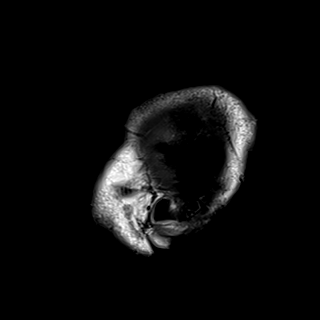

[Series 14: T2 · axial · 5.0mm · 0.72mm/px · z∈[-30,+112]mm · 2 of 25 slices shown (1 of 3)]
[im 1/25]
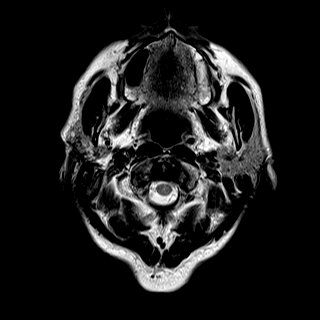
[im 25/25]
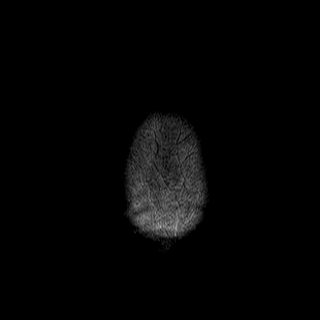

[Series 15: FLAIR · axial · 5.0mm · 0.45mm/px · z∈[-31,+112]mm · 2 of 25 slices shown (1 of 2)]
[im 1/25]
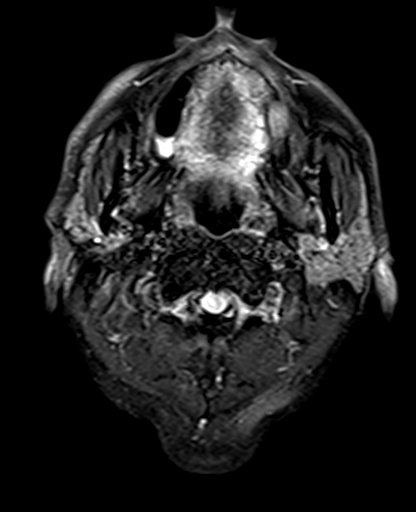
[im 25/25]
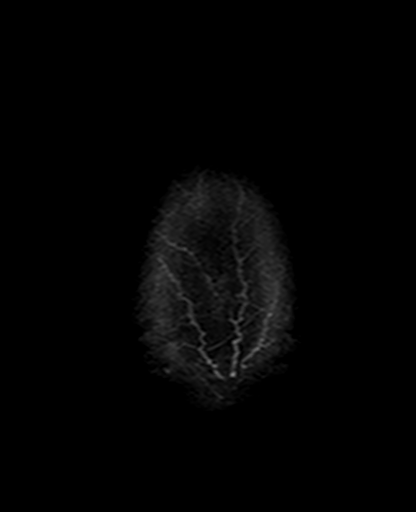

[Series 16: FLAIR · coronal · 3.0mm · 0.56mm/px · 3 of 35 slices shown (2 of 2)]
[im 1/35]
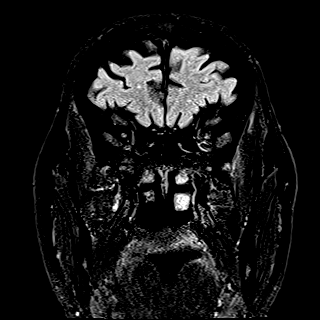
[im 18/35]
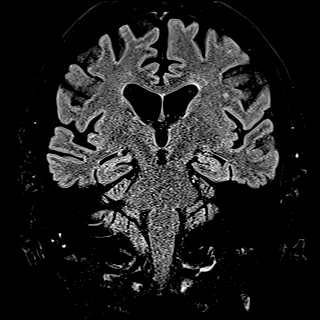
[im 35/35]
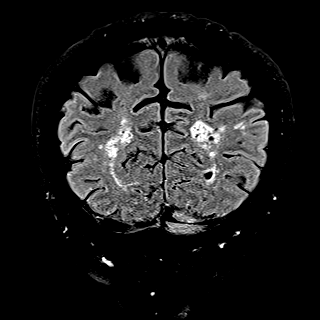

[Series 17: T2 · coronal · 3.0mm · 0.27mm/px · 3 of 35 slices shown (2 of 3)]
[im 1/35]
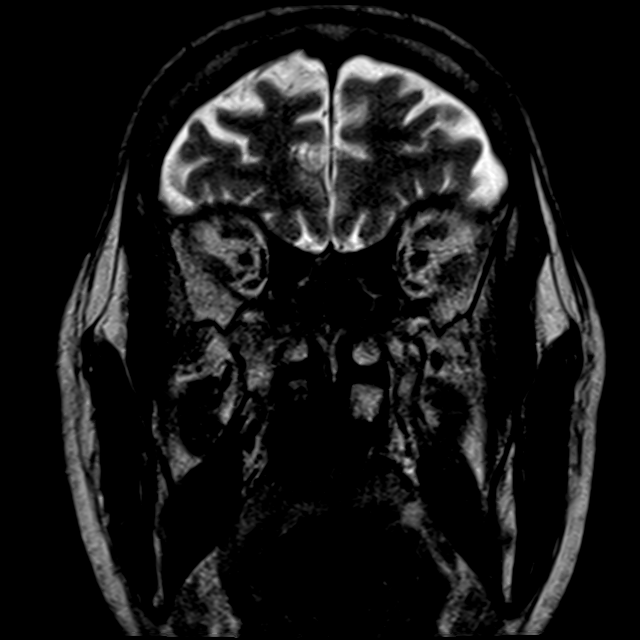
[im 18/35]
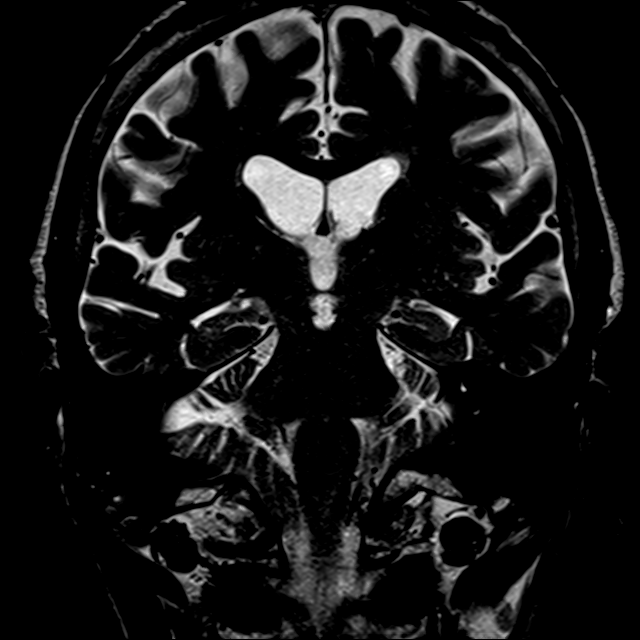
[im 35/35]
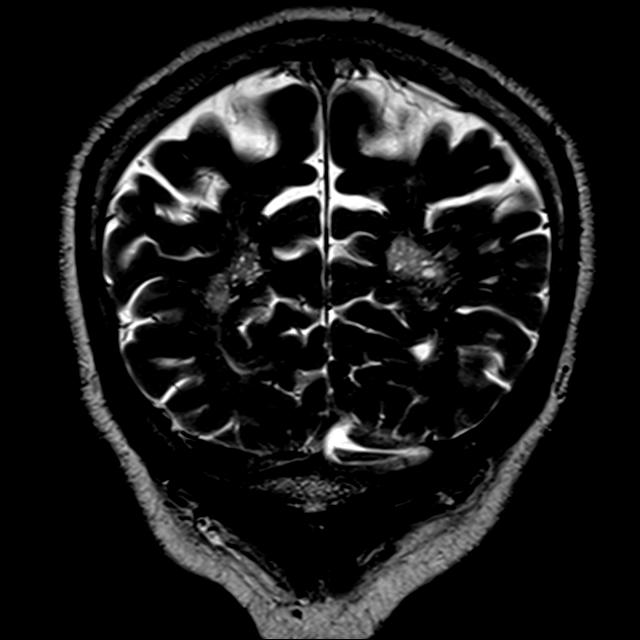

[Series 19: mag_images · axial · 3.0mm · 0.90mm/px · z∈[-29,+110]mm · 3 of 48 slices shown]
[im 1/48]
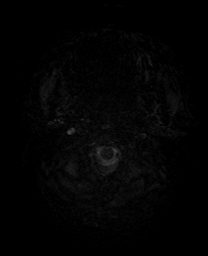
[im 24/48]
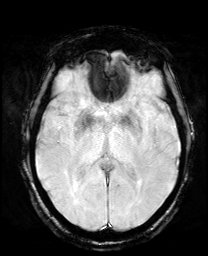
[im 48/48]
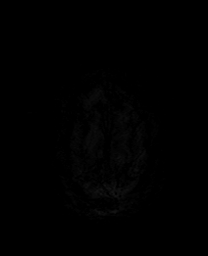

[Series 20: pha_images · axial · 3.0mm · 0.90mm/px · z∈[-29,+110]mm · 3 of 48 slices shown]
[im 1/48]
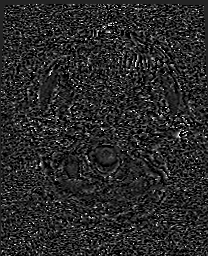
[im 24/48]
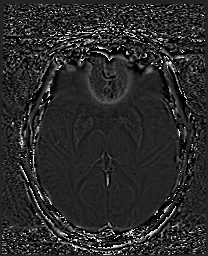
[im 48/48]
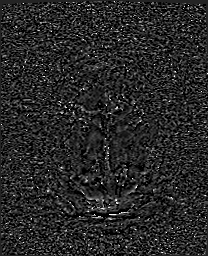

[Series 21: swi_images · axial · 3.0mm · 0.90mm/px · z∈[-29,+110]mm · 3 of 48 slices shown]
[im 1/48]
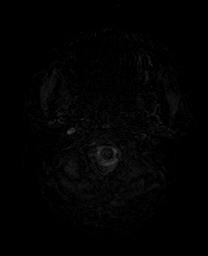
[im 24/48]
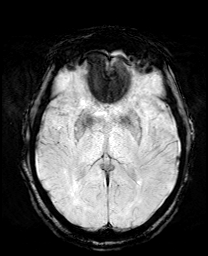
[im 48/48]
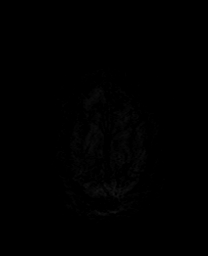

[Series 22: mip_images(sw) · axial · 24.0mm · 0.90mm/px · z∈[-19,+100]mm · 3 of 41 slices shown]
[im 1/41]
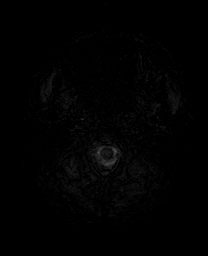
[im 21/41]
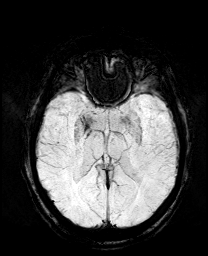
[im 41/41]
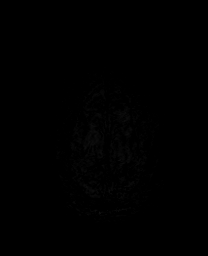

[Series 23: T2 · coronal · 5.0mm · 0.43mm/px · 2 of 29 slices shown (3 of 3)]
[im 1/29]
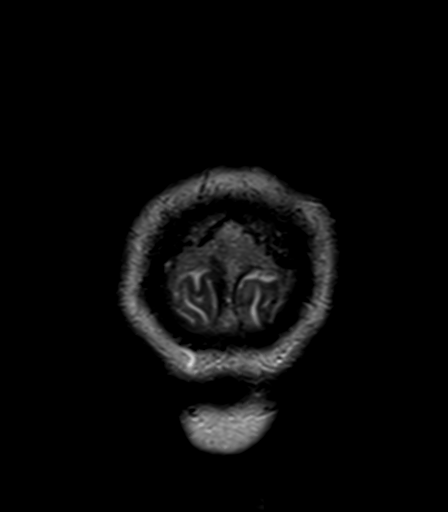
[im 29/29]
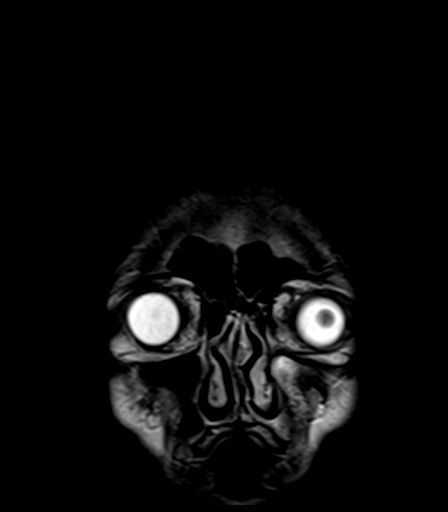

[45 of 48 positions shown; findings below may reference images not displayed]

FINDINGS: Brain: No acute or subacute infarction, hemorrhage, hydrocephalus,
extra-axial collection or mass lesion.

No cortical or hippocampal finding to explain seizure. Normal brain
volume.

Patchy FLAIR hyperintensity in the cerebral white matter attributed
to chronic small vessel ischemia-mild for age.

Gradient signal along the body of the right lateral ventricle which
may be from old hemorrhage, infarct, or venous anomaly. No
generalized chronic hemorrhagic injury.

Vascular: Small basilar in the setting of fetal type bilateral PCA.

Skull and upper cervical spine: Normal marrow signal

Sinuses/Orbits: Opacified left maxillary sinus with central
hypointense inspissated material and peripheral mucosal thickening.
IMPRESSION: 1. No cortical finding to explain seizure.
2. Chronic small vessel ischemia that appears mild for age.
3. Obstructed left maxillary sinus.

## 2022-02-19 ENCOUNTER — Encounter: Payer: Self-pay | Admitting: Internal Medicine

## 2022-02-19 ENCOUNTER — Ambulatory Visit (INDEPENDENT_AMBULATORY_CARE_PROVIDER_SITE_OTHER): Payer: Medicare HMO | Admitting: Internal Medicine

## 2022-02-19 ENCOUNTER — Other Ambulatory Visit: Payer: Self-pay

## 2022-02-19 VITALS — BP 160/70 | HR 68 | Ht 69.0 in | Wt 171.1 lb

## 2022-02-19 DIAGNOSIS — Z950 Presence of cardiac pacemaker: Secondary | ICD-10-CM

## 2022-02-19 DIAGNOSIS — I442 Atrioventricular block, complete: Secondary | ICD-10-CM

## 2022-02-19 LAB — PACEMAKER DEVICE OBSERVATION

## 2022-02-19 MED ORDER — MIDODRINE HCL 2.5 MG PO TABS: ORAL_TABLET | ORAL | 11 refills | Status: DC

## 2022-02-19 NOTE — Patient Instructions (Signed)
Medication Instructions:  - Your physician has recommended you make the following change in your medication:   1) START Proamatine (Midodrine) 2.5 mg: - take 1 tablet (2.5 mg) twice daily after breakfast and after lunch  *If you need a refill on your cardiac medications before your next appointment, please call your pharmacy*   Lab Work: - none ordered  If you have labs (blood work) drawn today and your tests are completely normal, you will receive your results only by: MyChart Message (if you have MyChart) OR A paper copy in the mail If you have any lab test that is abnormal or we need to change your treatment, we will call you to review the results.   Testing/Procedures: - none ordered   Follow-Up: At [facility], you and your health needs are our priority.  As part of our continuing mission to provide you with exceptional heart care, we have created designated Provider Care Teams.  These Care Teams include your primary Cardiologist (physician) and Advanced Practice Providers (APPs -  Physician Assistants and Nurse Practitioners) who all work together to provide you with the care you need, when you need it.  We recommend signing up for the patient portal called "MyChart".  Sign up information is provided on this After Visit Summary.  MyChart is used to connect with patients for Virtual Visits (Telemedicine).  Patients are able to view lab/test results, encounter notes, upcoming appointments, etc.  Non-urgent messages can be sent to your provider as well.   To learn more about what you can do with MyChart, go to [email or web address].    Your next appointment:   1 year(s)  The format for your next appointment:   In Person  Provider:   Sherryl Manges, MD    Other Instructions  PROAMATINE (Midodrine) tablets What is this medication? MIDODRINE (MI doe dreen) is used to treat low blood pressure in patients who have symptoms like dizziness when going from a sitting to a  standing position. This medicine may be used for other purposes; ask your health care provider or pharmacist if you have questions. COMMON BRAND NAME(S): Orvaten, ProAmatine What should I tell my care team before I take this medication? They need to know if you have any of the following conditions: difficulty passing urine heart disease high blood pressure kidney disease over active thyroid pheochromocytoma an unusual or allergic reaction to midodrine, other medicines, foods, dyes, or preservatives pregnant or trying to get pregnant breast-feeding How should I use this medication? Take this medicine by mouth with a glass of water. Follow the directions on the prescription label. The last dose of this medicine should not be taken after the evening meal or less than 4 hours before bedtime. When you lie down for any length of time after taking this medicine, high blood pressure can occur. Do not take this medicine if you will be lying down for any length of time. Do not take your medicine more often than directed. Do not stop taking except on your doctor's advice. Talk to your pediatrician regarding the use of this medicine in children. Special care may be needed. Overdosage: If you think you have taken too much of this medicine contact a poison control center or emergency room at once. NOTE: This medicine is only for you. Do not share this medicine with others. What if I miss a dose? If you miss a dose, take it as soon as you can. If it is almost time for your next dose, take  only that dose. Do not take double or extra doses. What may interact with this medication? Do not take this medicine with any of the following medications: MAOIs like Carbex, Eldepryl, Marplan, Nardil, and Parnate medicines called ergot alkaloids medicines for colds and breathing difficulties or weight loss procarbazine This medicine may also interact with the following  medications: cimetidine digoxin flecainide fludrocortisone metformin procainamide quinidine ranitidine triamterene medicines called alpha-blockers like doxazosin, prazosin, and terazosin This list may not describe all possible interactions. Give your health care provider a list of all the medicines, herbs, non-prescription drugs, or dietary supplements you use. Also tell them if you smoke, drink alcohol, or use illegal drugs. Some items may interact with your medicine. What should I watch for while using this medication? Visit your doctor or health care professional for regular checks on your progress. You may get drowsy or dizzy. Do not drive, use machinery, or do anything that needs mental alertness until you know how this medicine affects you. Do not stand or sit up quickly, especially if you are an older patient. This reduces the risk of dizzy or fainting spells. Your mouth may get dry. Chewing sugarless gum or sucking hard candy, and drinking plenty of water may help. Contact your doctor if the problem does not go away or is severe. Do not treat yourself for coughs, colds, or pain while you are taking this medicine without asking your doctor or health care professional for advice. Some ingredients may increase your blood pressure. What side effects may I notice from receiving this medication? Side effects that you should report to your doctor or health care professional as soon as possible: awareness of heart beating blurred vision headache irregular heartbeat, palpitations, or chest pain pounding in the ears skin rash, hives Side effects that usually do not require medical attention (report to your doctor or health care professional if they continue or are bothersome): change in heart rate chills goose bumps increased need to urinate itching stomach pain tingling in the skin or scalp This list may not describe all possible side effects. Call your doctor for medical advice about  side effects. You may report side effects to FDA at 1-800-FDA-1088. Where should I keep my medication? Keep out of the reach of children. Store at room temperature between 15 and 30 degrees C (59 and 86 degrees F). Throw away any unused medicine after the expiration date. NOTE: This sheet is a summary. It may not cover all possible information. If you have questions about this medicine, talk to your doctor, pharmacist, or health care provider.  2022 Elsevier/Gold Standard (2008-08-10 00:00:00)

## 2022-02-19 NOTE — Progress Notes (Signed)
Patient Care Team: Baxter Hire, MD as PCP - General (Internal Medicine)   HPI  Fernando [name] is a 87 y.o. male Seen in follow-up for dyspnea associated with complete heart block and a narrow QRS escape for which He underwent His bundle pacing 2/18.   Significant fatigue and exhausted with 20-40 feet of walking difficulty with prolonged standing "gets Wobbly, "increasing sleepiness  History of hypokalemia and hypertension  Significant post Zoster pain for which some medicine was assoc with intolerances   DATE TEST EF   2/17    Myoview   56` % No ischemia exercised only 3'51" min--Max HR 115  2/17  Echo  55-60 % Normal LA and mild RVE and RAE  11/20 Echo  40%    Date Cr K Hgb  8/21 1.43 3.2 14.4  12/21 1.3 3.7 13.8  5/22 1.2 3.4 14.6  1/23 1.1 3.3 13.6        Past Medical History:  Diagnosis Date   Asthma    Complete heart block ([place]) 06/28/2016   a. s/p MDT His Bundle pacemaker 2018   Diabetes mellitus without complication ([place])    Hypercholesteremia    Hypertension    Memory loss    Renal disorder     Past Surgical History:  Procedure Laterality Date   Left Arm Surgery     PACEMAKER IMPLANT N/A 02/05/2017   MDT His Bundle dual chamber PPM implanted by Dr Caryl Comes for [facility]     Current Outpatient Medications  Medication Sig Dispense Refill   albuterol (PROVENTIL HFA;VENTOLIN HFA) 108 (90 Base) MCG/ACT inhaler Inhale 2 puffs into the lungs every 6 (six) hours as needed for wheezing or shortness of breath.     amLODipine (NORVASC) 10 MG tablet Take 1 tablet by mouth daily.     aspirin EC 81 MG tablet Take 81 mg by mouth daily.     benazepril (LOTENSIN) 10 MG tablet Take 20 mg by mouth daily.      carvedilol (COREG) 12.5 MG tablet TAKE 1 TABLET (12.5 MG TOTAL) BY MOUTH 2 (TWO) TIMES DAILY WITH A MEAL. 60 tablet 3   chlorthalidone (HYGROTON) 25 MG tablet Take 25 mg by mouth daily.     clobetasol cream (TEMOVATE) 0.05 % APPLY TO AFFECTED AREA ON LEGS  AND KNEES FOR THICK SCALY AND BUMPY AREAS ONCE TO TWICE DAILY AS NEEDED. AVOID, FACE, GROIN AND UNDERARMS 60 g 0   clonazePAM (KLONOPIN) 0.5 MG tablet Take 0.25 mg by mouth in the morning and at bedtime.     donepezil (ARICEPT) 10 MG tablet Take 10 mg by mouth daily.     Fluticasone-Salmeterol (ADVAIR) 250-50 MCG/DOSE AEPB Inhale 1 puff into the lungs 2 (two) times daily.     glipiZIDE (GLUCOTROL XL) 10 MG 24 hr tablet Take 10 mg by mouth daily with breakfast.     memantine (NAMENDA) 10 MG tablet Take by mouth.     potassium chloride (KLOR-CON) 10 MEQ tablet Take 10 mEq by mouth daily.     simvastatin (ZOCOR) 40 MG tablet Take 40 mg by mouth at bedtime.     traZODone (DESYREL) 50 MG tablet Take 25 mg by mouth daily.  1   triamcinolone (KENALOG) 0.1 % APPLY TO AFFECTED AREA ON BACK, BUTTOCKS AND ELBOW ONCE TO TWICE DAILY ARE NEEDED. AVOID, FACE, GROIN AND UNDERARMS 80 g 2   No current facility-administered medications for this visit.    Allergies  Allergen Reactions  Shellfish Allergy Anaphylaxis   Gabapentin Other (See Comments)    hallucinations   Nortriptyline Other (See Comments)    hallucinations      Review of Systems negative except from HPI and PMH  Physical Exam BP (!) 160/70 (BP Location: Right Arm, Patient Position: Sitting, Cuff Size: Normal)    Pulse 68    Ht 5\' 9"  (1.753 m)    Wt 171 lb 2 oz (77.6 kg)    SpO2 97%    BMI 25.27 kg/m  Well developed and well nourished in no acute distress HENT normal Neck supple with JVP-flat Clear Device pocket well healed; without hematoma or erythema.  There is no tethering  Regular rate and rhythm, no murmur Abd-soft with active BS No Clubbing cyanosis  edema Skin-warm and dry A   Grossly normal sensory and motor function  ECG sinus with P synchronous pacing   Assessment and  Plan  High-grade heart block 2:1   Pacemaker-His bundle   Hypertension  Hypokalemia  Orthostatic lightheadedness  Diabetes  Post Zoster  neuralgia   functional status is difficult to assess.  Dementia is worse and he is largely nonambulatory and sleepy.  His orthostatic lightheadedness is potentially problematic as he is at risk for fall.  We discussed compressive wear versus pharmacotherapy; his wife would prefer the latter, we will begin him on ProAmatine 2.5 mg twice a day when he awakens in the morning and after lunch.  If this does not suffice, we could add compressive wear with an abdominal binder first.  He is hypokalemic we will begin him on spironolactone 12.5 mg daily to help with his blood pressure-systolic as well as his hypokalemia.  We will check his BMP in about 2 weeks.  His functional status is sufficiently limited in the absence of resting symptoms approximately 2 avoid undertaking an echo

## 2022-02-21 ENCOUNTER — Telehealth: Payer: Self-pay | Admitting: Internal Medicine

## 2022-02-21 DIAGNOSIS — E876 Hypokalemia: Secondary | ICD-10-CM

## 2022-02-21 DIAGNOSIS — Z79899 Other long term (current) drug therapy: Secondary | ICD-10-CM

## 2022-02-21 MED ORDER — SPIRONOLACTONE 25 MG PO TABS: 12.5000 mg | ORAL_TABLET | Freq: Every day | ORAL | 1 refills | Status: DC

## 2022-02-21 NOTE — Telephone Encounter (Signed)
The patient was seen in the office by Dr. Graciela Husbands on 02/18/22. After the patient's visit, I was notified by Dr. Graciela Husbands that the patient needs to:  1) START spironolactone 12.5 mg once daily 2) BMP in 2 weeks.   I have called and spoke with the patient's wife regarding the above MD recommendations. She voices understanding and is agreeable.  I have advised her that the medication will come as: Spironolactone 25 mg- take 0.5 tablet (12.5 mg) by mouth once daily   She voiced understanding of this as well.  She is agreeable with bringing the patient for a repeat BMP on Friday 03/08/22 at 9:30 am.   She was appreciative of the call today.

## 2022-03-08 ENCOUNTER — Other Ambulatory Visit: Payer: Self-pay

## 2022-03-08 ENCOUNTER — Other Ambulatory Visit (INDEPENDENT_AMBULATORY_CARE_PROVIDER_SITE_OTHER): Payer: Medicare HMO

## 2022-03-08 ENCOUNTER — Telehealth: Payer: Self-pay | Admitting: Internal Medicine

## 2022-03-08 DIAGNOSIS — E876 Hypokalemia: Secondary | ICD-10-CM

## 2022-03-08 DIAGNOSIS — Z79899 Other long term (current) drug therapy: Secondary | ICD-10-CM

## 2022-03-08 NOTE — Telephone Encounter (Signed)
? ?  Pt's wife said, they received a letter regarding missed transmission, she said, when pt saw Dr. Graciela Husbands they were told that the transmission was being received at [facility] clinic, they need help on how to transfer his transmission ?

## 2022-03-09 LAB — BASIC METABOLIC PANEL
BUN/Creatinine Ratio: 11 (ref 10–24)
BUN: 15 mg/dL (ref 8–27)
CO2: 22 mmol/L (ref 20–29)
Calcium: 9.7 mg/dL (ref 8.6–10.2)
Chloride: 94 mmol/L — ABNORMAL LOW (ref 96–106)
Creatinine, Ser: 1.31 mg/dL — ABNORMAL HIGH (ref 0.76–1.27)
Glucose: 188 mg/dL — ABNORMAL HIGH (ref 70–99)
Potassium: 4.1 mmol/L (ref 3.5–5.2)
Sodium: 133 mmol/L — ABNORMAL LOW (ref 134–144)
eGFR: 54 mL/min/{1.73_m2} — ABNORMAL LOW (ref >59)

## 2022-03-13 ENCOUNTER — Other Ambulatory Visit: Payer: Self-pay | Admitting: Internal Medicine

## 2022-05-13 ENCOUNTER — Ambulatory Visit (INDEPENDENT_AMBULATORY_CARE_PROVIDER_SITE_OTHER): Payer: Medicare HMO

## 2022-05-13 DIAGNOSIS — I442 Atrioventricular block, complete: Secondary | ICD-10-CM | POA: Diagnosis not present

## 2022-05-14 LAB — CUP PACEART REMOTE DEVICE CHECK
Battery Remaining Longevity: 28 mo
Battery Voltage: 2.92 V
Brady Statistic AP VP Percent: 17.68 %
Brady Statistic AP VS Percent: 0 %
Brady Statistic AS VP Percent: 81.94 %
Brady Statistic AS VS Percent: 0.37 %
Brady Statistic RA Percent Paced: 17.93 %
Brady Statistic RV Percent Paced: 99.63 %
Date Time Interrogation Session: 20230514232920
Implantable Lead Implant Date: 20180207
Implantable Lead Implant Date: 20180207
Implantable Lead Location: 753859
Implantable Lead Location: 753860
Implantable Lead Model: 3830
Implantable Lead Model: 5076
Implantable Pulse Generator Implant Date: 20180207
Lead Channel Impedance Value: 342 Ohm
Lead Channel Impedance Value: 380 Ohm
Lead Channel Impedance Value: 418 Ohm
Lead Channel Impedance Value: 494 Ohm
Lead Channel Pacing Threshold Amplitude: 0.625 V
Lead Channel Pacing Threshold Pulse Width: 0.4 ms
Lead Channel Sensing Intrinsic Amplitude: 12.375 mV
Lead Channel Sensing Intrinsic Amplitude: 12.375 mV
Lead Channel Sensing Intrinsic Amplitude: 4.625 mV
Lead Channel Sensing Intrinsic Amplitude: 4.625 mV
Lead Channel Setting Pacing Amplitude: 2 V
Lead Channel Setting Pacing Amplitude: 2.5 V
Lead Channel Setting Pacing Pulse Width: 1 ms
Lead Channel Setting Sensing Sensitivity: 2 mV

## 2022-05-28 ENCOUNTER — Emergency Department (HOSPITAL_COMMUNITY): Payer: Medicare HMO

## 2022-05-28 ENCOUNTER — Emergency Department (HOSPITAL_COMMUNITY)
Admission: EM | Admit: 2022-05-28 | Discharge: 2022-05-29 | Disposition: A | Discharge status: Home / Self Care | Payer: Medicare HMO | Attending: Emergency Medicine | Admitting: Emergency Medicine

## 2022-05-28 DIAGNOSIS — Z7982 Long term (current) use of aspirin: Secondary | ICD-10-CM | POA: Insufficient documentation

## 2022-05-28 DIAGNOSIS — R410 Disorientation, unspecified: Secondary | ICD-10-CM | POA: Diagnosis not present

## 2022-05-28 DIAGNOSIS — F0390 Unspecified dementia without behavioral disturbance: Secondary | ICD-10-CM | POA: Insufficient documentation

## 2022-05-28 DIAGNOSIS — R7309 Other abnormal glucose: Secondary | ICD-10-CM | POA: Insufficient documentation

## 2022-05-28 DIAGNOSIS — Z79899 Other long term (current) drug therapy: Secondary | ICD-10-CM | POA: Diagnosis not present

## 2022-05-28 DIAGNOSIS — R531 Weakness: Secondary | ICD-10-CM | POA: Insufficient documentation

## 2022-05-28 LAB — CBC
HCT: 39.4 % (ref 39.0–52.0)
Hemoglobin: 13.6 g/dL (ref 13.0–17.0)
MCH: 30.1 pg (ref 26.0–34.0)
MCHC: 34.5 g/dL (ref 30.0–36.0)
MCV: 87.2 fL (ref 80.0–100.0)
Platelets: 202 10*3/uL (ref 150–400)
RBC: 4.52 MIL/uL (ref 4.22–5.81)
RDW: 13.1 % (ref 11.5–15.5)
WBC: 6.9 10*3/uL (ref 4.0–10.5)
nRBC: 0 % (ref 0.0–0.2)

## 2022-05-28 LAB — TROPONIN I (HIGH SENSITIVITY)
Troponin I (High Sensitivity): 7 ng/L
Troponin I (High Sensitivity): 10 ng/L (ref <18)

## 2022-05-28 LAB — URINALYSIS, ROUTINE W REFLEX MICROSCOPIC
Bacteria, UA: NONE SEEN
Bilirubin Urine: NEGATIVE
Glucose, UA: NEGATIVE mg/dL
Ketones, ur: NEGATIVE mg/dL
Leukocytes,Ua: NEGATIVE
Nitrite: NEGATIVE
Protein, ur: NEGATIVE mg/dL
Specific Gravity, Urine: 1.016 (ref 1.005–1.030)
pH: 5 (ref 5.0–8.0)

## 2022-05-28 LAB — BASIC METABOLIC PANEL WITH GFR
Anion gap: 7 (ref 5–15)
BUN: 16 mg/dL (ref 8–23)
CO2: 25 mmol/L (ref 22–32)
Calcium: 8.3 mg/dL — ABNORMAL LOW (ref 8.9–10.3)
Chloride: 105 mmol/L (ref 98–111)
Creatinine, Ser: 1.01 mg/dL (ref 0.61–1.24)
GFR, Estimated: >60 mL/min
Glucose, Bld: 102 mg/dL — ABNORMAL HIGH (ref 70–99)
Potassium: 3.5 mmol/L (ref 3.5–5.1)
Sodium: 137 mmol/L (ref 135–145)

## 2022-05-28 LAB — CBG MONITORING, ED: Glucose-Capillary: 182 mg/dL — ABNORMAL HIGH (ref 70–99)

## 2022-05-28 NOTE — ED Provider Notes (Signed)
[facility] Provider Note   CSN: [id number] Arrival date & time: 05/28/22  1745     History  Chief Complaint  Patient presents with   Weakness    Fernando [name] is a 87 y.o. male with a past medical history of dementia who presents to the emergency department with family members with concerns for generalized weakness and worsening confusion onset 3 days.  Wife stated that today she noticed the patient with repetitive facial movements.  Patient denies any complaints today.  No meds given prior to arrival to the ED.  Denies chest pain, shortness of breath, fever, chills, abdominal pain, nausea, vomiting, dysuria, hematuria, headache, vision changes.  The history is provided by the patient, a relative and the spouse. No language interpreter was used.      Home Medications Prior to Admission medications   Medication Sig Start Date End Date Taking? Authorizing Provider  albuterol (PROVENTIL HFA;VENTOLIN HFA) 108 (90 Base) MCG/ACT inhaler Inhale 2 puffs into the lungs every 6 (six) hours as needed for wheezing or shortness of breath.    [provider]  amLODipine (NORVASC) 10 MG tablet Take 1 tablet by mouth daily. 12/04/21   [provider]  aspirin EC 81 MG tablet Take 81 mg by mouth daily.    [provider]  benazepril (LOTENSIN) 10 MG tablet Take 20 mg by mouth daily.  06/26/16   [provider]  carvedilol (COREG) 12.5 MG tablet TAKE 1 TABLET (12.5 MG TOTAL) BY MOUTH 2 (TWO) TIMES DAILY WITH A MEAL. 06/04/18   Duke SalviaKlein, Steven C, MD  chlorthalidone (HYGROTON) 25 MG tablet Take 25 mg by mouth daily.    [provider]  clobetasol cream (TEMOVATE) 0.05 % APPLY TO AFFECTED AREA ON LEGS AND KNEES FOR THICK SCALY AND BUMPY AREAS ONCE TO TWICE DAILY AS NEEDED. Carmine SavoyVOID, FACE, GROIN AND UNDERARMS 06/19/21   Willeen NieceStewart, Tara, MD  clonazePAM (KLONOPIN) 0.5 MG tablet Take 0.25 mg by mouth in the morning and at bedtime.  01/09/21   [provider]  donepezil (ARICEPT) 10 MG tablet Take 10 mg by mouth daily. 12/15/17   [provider]  Fluticasone-Salmeterol (ADVAIR) 250-50 MCG/DOSE AEPB Inhale 1 puff into the lungs 2 (two) times daily.    [provider]  glipiZIDE (GLUCOTROL XL) 10 MG 24 hr tablet Take 10 mg by mouth daily with breakfast.    [provider]  memantine (NAMENDA) 10 MG tablet Take by mouth. 12/21/19   [provider]  midodrine (PROAMATINE) 2.5 MG tablet Take 1 tablet (2.5 mg) by mouth twice daily after breakfast and after lunch 02/19/22   Duke SalviaKlein, Steven C, MD  potassium chloride (KLOR-CON) 10 MEQ tablet Take 10 mEq by mouth daily. 11/27/20   [provider]  simvastatin (ZOCOR) 40 MG tablet Take 40 mg by mouth at bedtime.    [provider]  spironolactone (ALDACTONE) 25 MG tablet TAKE 1/2 TABLET BY MOUTH EVERY DAY 03/13/22   Duke SalviaKlein, Steven C, MD  traZODone (DESYREL) 50 MG tablet Take 25 mg by mouth daily. 12/15/17   [provider]  triamcinolone (KENALOG) 0.1 % APPLY TO AFFECTED AREA ON BACK, BUTTOCKS AND ELBOW ONCE TO TWICE DAILY ARE NEEDED. Carmine SavoyVOID, FACE, GROIN AND UNDERARMS 01/09/21   Willeen NieceStewart, Tara, MD      Allergies    Shellfish allergy, Gabapentin, and Nortriptyline    Review of Systems   Review of Systems  Constitutional:  Negative for chills and fever.  Respiratory:  Negative for shortness of breath.   Cardiovascular:  Negative for chest pain.  Gastrointestinal:  Negative for abdominal pain, nausea and vomiting.  Genitourinary:  Negative for dysuria and hematuria.  Neurological:  Positive for weakness.  All other systems reviewed and are negative.  Physical Exam Updated Vital Signs BP (!) 159/77 (BP Location: Right Arm)   Pulse 97   Temp (!) 97.3 F (36.3 C) (Oral)   Resp 16   SpO2 100%  Physical Exam Vitals and nursing note reviewed.  Constitutional:      General: He is not in acute distress.     Appearance: He is not toxic-appearing or diaphoretic.     Comments: Nontoxic appearing.  HENT:     Head: Normocephalic and atraumatic.     Mouth/Throat:     Pharynx: No oropharyngeal exudate.  Eyes:     General: No scleral icterus.    Conjunctiva/sclera: Conjunctivae normal.  Cardiovascular:     Rate and Rhythm: Normal rate and regular rhythm.     Pulses: Normal pulses.     Heart sounds: Normal heart sounds.  Pulmonary:     Effort: Pulmonary effort is normal. No respiratory distress.     Breath sounds: Normal breath sounds. No wheezing.  Abdominal:     General: Bowel sounds are normal.     Palpations: Abdomen is soft. There is no mass.     Tenderness: There is no abdominal tenderness. There is no guarding or rebound.  Musculoskeletal:        General: Normal range of motion.     Cervical back: Normal range of motion and neck supple.     Comments: Strength and sensation intact to bilateral upper and lower extremities.  Skin:    General: Skin is warm and dry.  Neurological:     General: No focal deficit present.     Mental Status: He is alert.     Cranial Nerves: Cranial nerves 2-12 are intact.     Sensory: Sensation is intact.     Motor: No pronator drift.  Psychiatric:        Behavior: Behavior normal.    ED Results / Procedures / Treatments   Labs (all labs ordered are listed, but only abnormal results are displayed) Labs Reviewed  URINALYSIS, ROUTINE W REFLEX MICROSCOPIC - Abnormal; Notable for the following components:      Result Value   Hgb urine dipstick MODERATE (*)    All other components within normal limits  BASIC METABOLIC PANEL - Abnormal; Notable for the following components:   Glucose, Bld 102 (*)    Calcium 8.3 (*)    All other components within normal limits  CBG MONITORING, ED - Abnormal; Notable for the following components:   Glucose-Capillary 182 (*)    All other components within normal limits  URINE CULTURE  CBC  TROPONIN I (HIGH SENSITIVITY)   TROPONIN I (HIGH SENSITIVITY)    EKG EKG Interpretation  Date/Time:  Tuesday May 28 2022 19:10:59 EDT Ventricular Rate:  69 PR Interval:  180 QRS Duration: 152 QT Interval:  464 QTC Calculation: 497 R Axis:   49 Text Interpretation: Atrial-sensed ventricular-paced rhythm Abnormal ECG When compared with ECG of 28-May-2022 19:09, No significant change since last tracing Confirmed by Meridee Score [phone]) on 05/28/2022 7:26:49 PM  Radiology CT Head Wo Contrast  Result Date: 05/28/2022 CLINICAL DATA:  Worsening confusion and weakness over the past 3 days. EXAM: CT HEAD WITHOUT CONTRAST TECHNIQUE: Contiguous axial images were obtained  from the base of the skull through the vertex without intravenous contrast. RADIATION DOSE REDUCTION: This exam was performed according to the departmental dose-optimization program which includes automated exposure control, adjustment of the mA and/or kV according to patient size and/or use of iterative reconstruction technique. COMPARISON:  MRI brain dated November 15, 2020. FINDINGS: Brain: No evidence of acute infarction, hemorrhage, hydrocephalus, extra-axial collection or mass lesion/mass effect. Stable mild chronic microvascular ischemic changes. Vascular: Atherosclerotic vascular calcification of the carotid siphons. No hyperdense vessel. Skull: Normal. Negative for fracture or focal lesion. Sinuses/Orbits: No acute finding. Chronic left maxillary sinus disease. Other: None. IMPRESSION: 1. No acute intracranial abnormality. Electronically Signed   By: Obie Dredge M.D.   On: 05/28/2022 20:41    Procedures Procedures    Medications Ordered in ED Medications - No data to display  ED Course/ Medical Decision Making/ A&P Clinical Course as of 05/28/22 2306  Tue May 28, 2022  2045 He is brought in by his wife for evaluation of change in behavior.  She said he has dementia and is forgetful sometimes hallucinates and talks to people who are not there.   Today she noticed some more repetitive facial movements.  He currently denies any complaints.  Is awake alert following commands.  Nontoxic-appearing.  Getting labs and imaging urinalysis.  Disposition per results of testing. [MB]  2204 Patient re-evaluated and resting comfortably on stretcher.  Discussed with patient and his wife regarding discharge treatment plan.  Answered all available questions.  Patient appears safe for discharge at this time. [SB]    Clinical Course User Index [MB] Terrilee Files, MD [SB] Finis Hendricksen A, PA-C                           Medical Decision Making Amount and/or Complexity of Data Reviewed Labs: ordered. Radiology: ordered.   Pt presents with generalized weakness onset 3 days.  Patient with a history of dementia.  Patient denies chest pain, shortness of breath, abdominal pain, nausea vomiting today. Vital signs, stable, patient afebrile, not tachycardic or hypoxic. On exam, pt with no focal neurological deficits.  Negative pronator drift. No acute cardiovascular, respiratory, abdominal exam findings. Differential diagnosis includes arrhythmia, anemia, electrolyte abnormality, pneumonia, intracranial abnormality, hypoglycemia.    Co morbidities that complicate the patient evaluation: Dementia  Additional history obtained:  Additional history obtained from Spouse/Significant Other  Labs:  I ordered, and personally interpreted labs.  The pertinent results include:   Initial troponin is 7, delta troponin at 10. Urinalysis notable for moderate amount of hemoglobin otherwise nitrate and leukocyte negative.  Urine culture sent. BMP with slightly elevated glucose otherwise unremarkable. CBC unremarkable  Imaging: I ordered imaging studies including CT head, chest x-ray I independently visualized and interpreted imaging which showed: No acute intracranial abnormality.  Chest x-ray without concerns for cardiopulmonary process. I agree with the radiologist  interpretation  Disposition: Patient with generalized weakness per family members.  Doubt arrhythmia, anemia, electrolyte abnormality, pneumonia, intracranial abnormality, hypoglycemia as cause of generalized weakness.  After consideration of the diagnostic results and the patients response to treatment, I feel that the patient would benefit from Discharge home.  Case discussed with attending, evaluated patient and agrees with discharge treatment plan at this time.  Discussed with patient and wife at bedside importance of having patient follow-up with primary care provider regarding today's ED visit.  Supportive care measures and strict return precautions discussed with patient at bedside. Pt acknowledges  and verbalizes understanding. Pt appears safe for discharge. Follow up as indicated in discharge paperwork.    This chart was dictated using voice recognition software, Dragon. Despite the best efforts of this provider to proofread and correct errors, errors may still occur which can change documentation meaning.  Final Clinical Impression(s) / ED Diagnoses Final diagnoses:  None    Rx / DC Orders ED Discharge Orders     None         Ceylon Arenson A, PA-C 05/28/22 2308    Terrilee Files, MD 05/29/22 1125

## 2022-05-28 NOTE — ED Triage Notes (Signed)
Hx of dementia. Here for worsening confusion and generalized weakness x 3 days. Alert and oriented x 3.

## 2022-05-28 NOTE — Discharge Instructions (Addendum)
It was a pleasure taking care of you today!   Your labs and imaging were unremarkable today.  It is important to call your primary care doctor and set up a follow-up appointment regarding today's ED visit.  Continue taking your home medications as prescribed.  Return to the emergency department for experiencing increasing/worsening chest pain, trouble breathing, difficulty urinating, abdominal pain, worsening symptoms.

## 2022-05-29 ENCOUNTER — Encounter (HOSPITAL_COMMUNITY): Payer: Self-pay

## 2022-05-29 ENCOUNTER — Other Ambulatory Visit: Payer: Self-pay

## 2022-05-29 LAB — URINE CULTURE

## 2022-05-30 NOTE — Progress Notes (Signed)
Remote pacemaker transmission.   

## 2022-08-12 ENCOUNTER — Ambulatory Visit: Payer: Medicare HMO

## 2022-08-12 LAB — CUP PACEART REMOTE DEVICE CHECK
Battery Remaining Longevity: 25 mo
Battery Voltage: 2.91 V
Brady Statistic AP VP Percent: 81.65 %
Brady Statistic AP VS Percent: 0 %
Brady Statistic AS VP Percent: 18.17 %
Brady Statistic AS VS Percent: 0.18 %
Brady Statistic RA Percent Paced: 81.72 %
Brady Statistic RV Percent Paced: 99.82 %
Date Time Interrogation Session: 20230814020007
Implantable Lead Implant Date: 20180207
Implantable Lead Implant Date: 20180207
Implantable Lead Location: 753859
Implantable Lead Location: 753860
Implantable Lead Model: 3830
Implantable Lead Model: 5076
Implantable Pulse Generator Implant Date: 20180207
Lead Channel Impedance Value: 342 Ohm
Lead Channel Impedance Value: 399 Ohm
Lead Channel Impedance Value: 418 Ohm
Lead Channel Impedance Value: 532 Ohm
Lead Channel Pacing Threshold Amplitude: 0.75 V
Lead Channel Pacing Threshold Pulse Width: 0.4 ms
Lead Channel Sensing Intrinsic Amplitude: 12.375 mV
Lead Channel Sensing Intrinsic Amplitude: 12.375 mV
Lead Channel Sensing Intrinsic Amplitude: 5.125 mV
Lead Channel Sensing Intrinsic Amplitude: 5.125 mV
Lead Channel Setting Pacing Amplitude: 2 V
Lead Channel Setting Pacing Amplitude: 2.5 V
Lead Channel Setting Pacing Pulse Width: 1 ms
Lead Channel Setting Sensing Sensitivity: 2 mV

## 2022-09-24 ENCOUNTER — Other Ambulatory Visit: Payer: Medicare HMO | Admitting: Student

## 2022-09-24 DIAGNOSIS — F0394 Unspecified dementia, unspecified severity, with anxiety: Secondary | ICD-10-CM

## 2022-09-24 DIAGNOSIS — R531 Weakness: Secondary | ICD-10-CM

## 2022-09-24 DIAGNOSIS — R52 Pain, unspecified: Secondary | ICD-10-CM

## 2022-09-24 DIAGNOSIS — Z515 Encounter for palliative care: Secondary | ICD-10-CM

## 2022-09-24 NOTE — Progress Notes (Unsigned)
Therapist, nutritional Palliative Care Consult Note Telephone: [phone]  Fax: [phone]   Date of encounter: 09/24/22 10:18 AM PATIENT NAME: Fernando [name] [address] [place] [address]   [phone] (home)  DOB: May 12, 1935 MRN: [id number] PRIMARY CARE PROVIDER:    Gracelyn Nurse, MD,  [address] [place] [address] [phone]  REFERRING PROVIDER:   Gracelyn Nurse, MD [address] [place],  [place] [address] [phone]  RESPONSIBLE PARTY:    Contact Information     Name Relation Home Work [place] [phone]  [phone]   [facility] Daughter   [phone]   Wynton, Hufstetler   [phone]   Turon, Kilmer Daughter   [phone]        I met face to face with patient and family in *** home/facility. Palliative Care was asked to follow this patient by consultation request of  Gracelyn Nurse, MD to address advance care planning and complex medical decision making. This is the initial visit.                                     ASSESSMENT AND PLAN / RECOMMENDATIONS:   Advance Care Planning/Goals of Care: Goals include to maximize quality of life and symptom management. Patient/health care surrogate gave his/her permission to discuss.Our advance care planning conversation included a discussion about:    The value and importance of advance care planning  Experiences with loved ones who have been seriously ill or have died  Exploration of personal, cultural or spiritual beliefs that might influence medical decisions  Exploration of goals of care in the event of a sudden injury or illness  Identification  of a healthcare agent  Review and updating or creation of an  advance directive document . Decision not to resuscitate or to de-escalate disease focused treatments due to poor prognosis. CODE STATUS:  Symptom Management/Plan:    Follow up Palliative Care Visit: Palliative care  will continue to follow for complex medical decision making, advance care planning, and clarification of goals. Return *** weeks or prn.  I spent *** minutes providing this consultation. More than 50% of the time in this consultation was spent in counseling and care coordination.  This visit was coded based on medical decision making (MDM).***  PPS: ***0%  HOSPICE ELIGIBILITY/DIAGNOSIS: TBD  Chief Complaint: Palliative Medicine initial consult.   HISTORY OF PRESENT ILLNESS:  Fernando [name] is a 87 y.o. year old male  with complete heart block, memory loss/dementia, asthma, T2DM, hypertension, hyperlipidemia,  .   Nerve damage from shingles; taking tramadol.   History obtained from review of EMR, discussion with primary team, and interview with family, facility staff/caregiver and/or Mr. Bonelli.  I reviewed available labs, medications, imaging, studies and related documents from the EMR.  Records reviewed and summarized above.   ROS  *** General: NAD EYES: denies vision changes ENMT: denies dysphagia Cardiovascular: denies chest pain, denies DOE Pulmonary: denies cough, denies increased SOB Abdomen: endorses good appetite, denies constipation, endorses continence of bowel GU: denies dysuria, endorses continence of urine MSK:  denies increased weakness,  no falls reported Skin: denies rashes or wounds Neurological: denies pain, denies insomnia Psych: Endorses positive mood Heme/lymph/immuno: denies bruises, abnormal bleeding  Physical Exam: Current and past weights: Constitutional: NAD General: frail appearing, thin/WNWD/obese  EYES: anicteric sclera, lids intact, no discharge  ENMT: intact hearing, oral  mucous membranes moist, dentition intact CV: S1S2, RRR, no LE edema Pulmonary: LCTA, no increased work of breathing, no cough, room air Abdomen: intake 100%, normo-active BS + 4 quadrants, soft and non tender, no ascites GU: deferred MSK: no sarcopenia, moves all  extremities, ambulatory Skin: warm and dry, no rashes or wounds on visible skin Neuro:  no generalized weakness,  no cognitive impairment Psych: non-anxious affect, A and O x 3 Hem/lymph/immuno: no widespread bruising CURRENT PROBLEM LIST:  Patient Active Problem List   Diagnosis Date Noted   Complete heart block (HCC) intermittent narrow QRS escape  06/28/2016   Mobitz type 2 second degree heart block  2:1 heart block 06/28/2016   PAST MEDICAL HISTORY:  Active Ambulatory Problems    Diagnosis Date Noted   Complete heart block (HCC) intermittent narrow QRS escape  06/28/2016   Mobitz type 2 second degree heart block  2:1 heart block 06/28/2016   Resolved Ambulatory Problems    Diagnosis Date Noted   No Resolved Ambulatory Problems   Past Medical History:  Diagnosis Date   Asthma    Diabetes mellitus without complication ([place])    Hypercholesteremia    Hypertension    Memory loss    Renal disorder    SOCIAL HX:  Social History   Tobacco Use   Smoking status: Never   Smokeless tobacco: Never  Substance Use Topics   Alcohol use: No   FAMILY HX: No family history on file.    ALLERGIES:  Allergies  Allergen Reactions   Shellfish Allergy Anaphylaxis   Gabapentin Other (See Comments)    hallucinations   Nortriptyline Other (See Comments)    hallucinations     PERTINENT MEDICATIONS:  Outpatient Encounter Medications as of 09/24/2022  Medication Sig   albuterol (PROVENTIL HFA;VENTOLIN HFA) 108 (90 Base) MCG/ACT inhaler Inhale 2 puffs into the lungs every 6 (six) hours as needed for wheezing or shortness of breath.   amLODipine (NORVASC) 10 MG tablet Take 1 tablet by mouth daily.   aspirin EC 81 MG tablet Take 81 mg by mouth daily.   benazepril (LOTENSIN) 10 MG tablet Take 20 mg by mouth daily.    carvedilol (COREG) 12.5 MG tablet TAKE 1 TABLET (12.5 MG TOTAL) BY MOUTH 2 (TWO) TIMES DAILY WITH A MEAL.   chlorthalidone (HYGROTON) 25 MG tablet Take 25 mg by mouth daily.    clobetasol cream (TEMOVATE) 0.05 % APPLY TO AFFECTED AREA ON LEGS AND KNEES FOR THICK SCALY AND BUMPY AREAS ONCE TO TWICE DAILY AS NEEDED. AVOID, FACE, GROIN AND UNDERARMS   clonazePAM (KLONOPIN) 0.5 MG tablet Take 0.25 mg by mouth in the morning and at bedtime.   donepezil (ARICEPT) 10 MG tablet Take 10 mg by mouth daily.   Fluticasone-Salmeterol (ADVAIR) 250-50 MCG/DOSE AEPB Inhale 1 puff into the lungs 2 (two) times daily.   glipiZIDE (GLUCOTROL XL) 10 MG 24 hr tablet Take 10 mg by mouth daily with breakfast.   memantine (NAMENDA) 10 MG tablet Take by mouth.   midodrine (PROAMATINE) 2.5 MG tablet Take 1 tablet (2.5 mg) by mouth twice daily after breakfast and after lunch   potassium chloride (KLOR-CON) 10 MEQ tablet Take 10 mEq by mouth daily.   simvastatin (ZOCOR) 40 MG tablet Take 40 mg by mouth at bedtime.   spironolactone (ALDACTONE) 25 MG tablet TAKE 1/2 TABLET BY MOUTH EVERY DAY   traZODone (DESYREL) 50 MG tablet Take 25 mg by mouth daily.   triamcinolone (KENALOG) 0.1 % APPLY TO AFFECTED  AREA ON BACK, BUTTOCKS AND ELBOW ONCE TO TWICE DAILY ARE NEEDED. AVOID, FACE, GROIN AND UNDERARMS   No facility-administered encounter medications on file as of 09/24/2022.   Thank you for the opportunity to participate in the care of Mr. Luna.  The palliative care team will continue to follow. Please call our office at [phone] if we can be of additional assistance.   Ezekiel Slocumb, NP  COVID-19 PATIENT SCREENING TOOL Asked and negative response unless otherwise noted:  Have you had symptoms of covid, tested positive or been in contact with someone with symptoms/positive test in the past 5-10 days? No

## 2022-11-11 ENCOUNTER — Ambulatory Visit (INDEPENDENT_AMBULATORY_CARE_PROVIDER_SITE_OTHER): Payer: Medicare HMO

## 2022-11-11 DIAGNOSIS — I442 Atrioventricular block, complete: Secondary | ICD-10-CM

## 2022-11-11 LAB — CUP PACEART REMOTE DEVICE CHECK
Battery Remaining Longevity: 22 mo
Battery Voltage: 2.9 V
Brady Statistic AP VP Percent: 42.09 %
Brady Statistic AP VS Percent: 0 %
Brady Statistic AS VP Percent: 57.74 %
Brady Statistic AS VS Percent: 0.17 %
Brady Statistic RA Percent Paced: 42.06 %
Brady Statistic RV Percent Paced: 99.83 %
Date Time Interrogation Session: 20231113001435
Implantable Lead Connection Status: 753985
Implantable Lead Connection Status: 753985
Implantable Lead Implant Date: 20180207
Implantable Lead Implant Date: 20180207
Implantable Lead Location: 753859
Implantable Lead Location: 753860
Implantable Lead Model: 3830
Implantable Lead Model: 5076
Implantable Pulse Generator Implant Date: 20180207
Lead Channel Impedance Value: 342 Ohm
Lead Channel Impedance Value: 418 Ohm
Lead Channel Impedance Value: 418 Ohm
Lead Channel Impedance Value: 570 Ohm
Lead Channel Pacing Threshold Amplitude: 0.75 V
Lead Channel Pacing Threshold Pulse Width: 0.4 ms
Lead Channel Sensing Intrinsic Amplitude: 4.125 mV
Lead Channel Sensing Intrinsic Amplitude: 4.125 mV
Lead Channel Sensing Intrinsic Amplitude: 5.375 mV
Lead Channel Sensing Intrinsic Amplitude: 5.375 mV
Lead Channel Setting Pacing Amplitude: 2 V
Lead Channel Setting Pacing Amplitude: 2.5 V
Lead Channel Setting Pacing Pulse Width: 1 ms
Lead Channel Setting Sensing Sensitivity: 2 mV
Zone Setting Status: 755011
Zone Setting Status: 755011

## 2022-11-17 ENCOUNTER — Other Ambulatory Visit: Payer: Self-pay | Admitting: Internal Medicine

## 2022-12-05 ENCOUNTER — Encounter (HOSPITAL_COMMUNITY): Payer: Self-pay

## 2022-12-05 ENCOUNTER — Other Ambulatory Visit: Payer: Self-pay

## 2022-12-05 ENCOUNTER — Emergency Department (HOSPITAL_COMMUNITY)
Admission: EM | Admit: 2022-12-05 | Discharge: 2022-12-05 | Disposition: A | Discharge status: Home / Self Care | Payer: Medicare HMO | Attending: Student | Admitting: Student

## 2022-12-05 ENCOUNTER — Emergency Department (HOSPITAL_COMMUNITY): Payer: Medicare HMO

## 2022-12-05 DIAGNOSIS — E119 Type 2 diabetes mellitus without complications: Secondary | ICD-10-CM | POA: Insufficient documentation

## 2022-12-05 DIAGNOSIS — I10 Essential (primary) hypertension: Secondary | ICD-10-CM | POA: Insufficient documentation

## 2022-12-05 DIAGNOSIS — Z7984 Long term (current) use of oral hypoglycemic drugs: Secondary | ICD-10-CM | POA: Insufficient documentation

## 2022-12-05 DIAGNOSIS — F0390 Unspecified dementia without behavioral disturbance: Secondary | ICD-10-CM | POA: Insufficient documentation

## 2022-12-05 DIAGNOSIS — Z79899 Other long term (current) drug therapy: Secondary | ICD-10-CM | POA: Insufficient documentation

## 2022-12-05 DIAGNOSIS — J45909 Unspecified asthma, uncomplicated: Secondary | ICD-10-CM | POA: Diagnosis not present

## 2022-12-05 DIAGNOSIS — Z7951 Long term (current) use of inhaled steroids: Secondary | ICD-10-CM | POA: Insufficient documentation

## 2022-12-05 DIAGNOSIS — R053 Chronic cough: Secondary | ICD-10-CM

## 2022-12-05 DIAGNOSIS — R059 Cough, unspecified: Secondary | ICD-10-CM | POA: Diagnosis present

## 2022-12-05 DIAGNOSIS — Z950 Presence of cardiac pacemaker: Secondary | ICD-10-CM | POA: Diagnosis not present

## 2022-12-05 DIAGNOSIS — Z7982 Long term (current) use of aspirin: Secondary | ICD-10-CM | POA: Insufficient documentation

## 2022-12-05 MED ORDER — IPRATROPIUM-ALBUTEROL 0.5-2.5 (3) MG/3ML IN SOLN
3.0000 mL | Freq: Once | RESPIRATORY_TRACT | Status: AC
  Administered 2022-12-05: 3 mL via RESPIRATORY_TRACT
  Filled 2022-12-05: qty 3

## 2022-12-05 MED ORDER — PREDNISONE 20 MG PO TABS
60.0000 mg | ORAL_TABLET | Freq: Once | ORAL | Status: AC
  Administered 2022-12-05: 60 mg via ORAL
  Filled 2022-12-05: qty 3

## 2022-12-05 MED ORDER — METHYLPREDNISOLONE 4 MG PO TBPK: ORAL_TABLET | ORAL | 0 refills | Status: DC

## 2022-12-05 NOTE — ED Provider Triage Note (Signed)
Emergency Medicine Provider Triage Evaluation Note  Fernando [name] , a 87 y.o. male  was evaluated in triage.  Pt complains of productive cough for the last few weeks. Has given mucinex without relief. Reports cough worse with laying down. Denies any shortness of breath. Reports cough worse at night.  Review of Systems  Positive: cough Negative: SOB  Physical Exam  BP (!) 150/84   Pulse 80   Temp 98 F (36.7 C)   Resp 20   Ht 5\' 9"  (1.753 m)   Wt 77 kg   SpO2 100%   BMI 25.07 kg/m  Gen:   Awake, no distress   Resp:  Normal effort  MSK:   Moves extremities without difficulty   Medical Decision Making  Medically screening exam initiated at 8:26 AM.  Appropriate orders placed.  was informed that the remainder of the evaluation will be completed by another provider, this initial triage assessment does not replace that evaluation, and the importance of remaining in the ED until their evaluation is complete.    Otho Darner, Pete Pelt 12/05/22 [phone]

## 2022-12-05 NOTE — ED Provider Notes (Signed)
[facility]EMERGENCY DEPT Provider Note  CSN: [id number] Arrival date & time: 12/05/22 6629  Chief Complaint(s) Cough  HPI Fernando [name] is a 87 y.o. male with PMH complete heart block with pacemaker placement, T2DM, asthma, dementia who presents emergency department for evaluation of persistent cough.  History obtained from patient's family member who states that he has had a cough for at least 1 month but symptoms appear to have worsened the last 24 hours.  She states that it is significant worse at night when lying flat and the patient has to sleep propped up.  They have tried [employer] and Mucinex at home with minimal relief.  States that they feel that there is persistent phlegm in the airways that he cannot clear.  Denies shortness of breath, chest pain, Donnell pain, nausea, vomiting or other systemic symptoms.  No fevers.   Past Medical History Past Medical History:  Diagnosis Date   Asthma    Complete heart block (HCC) 06/28/2016   a. s/p MDT His Bundle pacemaker 2018   Diabetes mellitus without complication (HCC)    Hypercholesteremia    Hypertension    Memory loss    Renal disorder    Patient Active Problem List   Diagnosis Date Noted   Complete heart block (HCC) intermittent narrow QRS escape  06/28/2016   Mobitz type 2 second degree heart block  2:1 heart block 06/28/2016   Home Medication(s) Prior to Admission medications   Medication Sig Start Date End Date Taking? Authorizing Provider  methylPREDNISolone (MEDROL DOSEPAK) 4 MG TBPK tablet Take as prescribed 12/05/22  Yes Antwian Santaana, MD  albuterol (PROVENTIL HFA;VENTOLIN HFA) 108 (90 Base) MCG/ACT inhaler Inhale 2 puffs into the lungs every 6 (six) hours as needed for wheezing or shortness of breath.    [provider]  amLODipine (NORVASC) 10 MG tablet Take 1 tablet by mouth daily. 12/04/21   [provider]  aspirin EC 81 MG tablet Take 81 mg by mouth daily.     [provider]  benazepril (LOTENSIN) 10 MG tablet Take 20 mg by mouth daily.  06/26/16   [provider]  carvedilol (COREG) 12.5 MG tablet TAKE 1 TABLET (12.5 MG TOTAL) BY MOUTH 2 (TWO) TIMES DAILY WITH A MEAL. 06/04/18   Duke Salvia, MD  chlorthalidone (HYGROTON) 25 MG tablet Take 25 mg by mouth daily.    [provider]  clobetasol cream (TEMOVATE) 0.05 % APPLY TO AFFECTED AREA ON LEGS AND KNEES FOR THICK SCALY AND BUMPY AREAS ONCE TO TWICE DAILY AS NEEDED. Carmine Savoy AND UNDERARMS 06/19/21   Willeen Niece, MD  clonazePAM (KLONOPIN) 0.5 MG tablet Take 0.25 mg by mouth in the morning and at bedtime. 01/09/21   [provider]  donepezil (ARICEPT) 10 MG tablet Take 10 mg by mouth daily. 12/15/17   [provider]  Fluticasone-Salmeterol (ADVAIR) 250-50 MCG/DOSE AEPB Inhale 1 puff into the lungs 2 (two) times daily.    [provider]  glipiZIDE (GLUCOTROL XL) 10 MG 24 hr tablet Take 10 mg by mouth daily with breakfast.    [provider]  memantine (NAMENDA) 10 MG tablet Take by mouth. 12/21/19   [provider]  midodrine (PROAMATINE) 2.5 MG tablet TAKE 1 TABLET (2.5 MG) BY MOUTH TWICE DAILY AFTER BREAKFAST AND AFTER LUNCH 11/18/22   Duke Salvia, MD  potassium chloride (KLOR-CON) 10 MEQ tablet Take 10 mEq by mouth daily. 11/27/20   [provider]  simvastatin (ZOCOR) 40 MG tablet Take 40 mg by mouth at bedtime.    [provider]  spironolactone (ALDACTONE) 25 MG tablet TAKE 1/2 TABLET BY MOUTH EVERY DAY 03/13/22   Duke SalviaKlein, Steven C, MD  traZODone (DESYREL) 50 MG tablet Take 25 mg by mouth daily. 12/15/17   [provider]  triamcinolone (KENALOG) 0.1 % APPLY TO AFFECTED AREA ON BACK, BUTTOCKS AND ELBOW ONCE TO TWICE DAILY ARE NEEDED. Carmine SavoyVOID, FACE, GROIN AND UNDERARMS 01/09/21   Willeen NieceStewart, Tara, MD                                                                                                                                     Past Surgical History Past Surgical History:  Procedure Laterality Date   Left Arm Surgery     PACEMAKER IMPLANT N/A 02/05/2017   MDT His Bundle dual chamber PPM implanted by Dr Graciela HusbandsKlein for CHB    Family History History reviewed. No pertinent family history.  Social History Social History   Tobacco Use   Smoking status: Never   Smokeless tobacco: Never  Vaping Use   Vaping Use: Never used  Substance Use Topics   Alcohol use: No   Drug use: No   Allergies Shellfish allergy, Amoxicillin, Gabapentin, and Nortriptyline  Review of Systems Review of Systems  Respiratory:  Positive for cough.     Physical Exam Vital Signs  I have reviewed the triage vital signs BP 132/84 (BP Location: Right Arm)   Pulse 78   Temp (!) 97.5 F (36.4 C) (Axillary)   Resp 16   Ht 5\' 9"  (1.753 m)   Wt 77 kg   SpO2 98%   BMI 25.07 kg/m   Physical Exam Constitutional:      General: He is not in acute distress.    Appearance: Normal appearance.  HENT:     Head: Normocephalic and atraumatic.     Nose: No congestion or rhinorrhea.  Eyes:     General:        Right eye: No discharge.        Left eye: No discharge.     Extraocular Movements: Extraocular movements intact.     Pupils: Pupils are equal, round, and reactive to light.  Cardiovascular:     Rate and Rhythm: Normal rate and regular rhythm.     Heart sounds: No murmur heard. Pulmonary:     Effort: No respiratory distress.     Breath sounds: No rales.  Abdominal:     General: There is no distension.     Tenderness: There is no abdominal tenderness.  Musculoskeletal:        General: Normal range of motion.     Cervical back: Normal range of motion.  Skin:    General: Skin is warm and dry.  Neurological:     General: No focal deficit present.     Mental Status: He is alert.  ED Results and Treatments Labs (all labs ordered are listed, but only abnormal results are displayed) Labs  Reviewed - No data to display                                                                                                                        Radiology DG Chest 2 View  Result Date: 12/05/2022 CLINICAL DATA:  Productive cough EXAM: CHEST - 2 VIEW COMPARISON:  Chest x-ray dated May 28, 2022 FINDINGS: The heart size and mediastinal contours are within normal limits. Left chest wall dual lead pacer with unchanged lead position. Both lungs are clear. The visualized skeletal structures are unremarkable. IMPRESSION: No active cardiopulmonary disease. Electronically Signed   By: Allegra Lai M.D.   On: 12/05/2022 09:03    Pertinent labs & imaging results that were available during my care of the patient were reviewed by me and considered in my medical decision making (see MDM for details).  Medications Ordered in ED Medications  predniSONE (DELTASONE) tablet 60 mg (60 mg Oral Given 12/05/22 1554)  ipratropium-albuterol (DUONEB) 0.5-2.5 (3) MG/3ML nebulizer solution 3 mL (3 mLs Nebulization Given 12/05/22 1556)                                                                                                                                     Procedures Procedures  (including critical care time)  Medical Decision Making / ED Course   This patient presents to the ED for concern of cough, this involves an extensive number of treatment options, and is a complaint that carries with it a high risk of complications and morbidity.  The differential diagnosis includes reactive airway disease, pneumonia, postviral cough, upper respiratory affection  MDM: Patient seen emergency room for evaluation of persistent cough.  Physical exam difficult to obtain as the patient is almost completely nonparticipatory and refuses to take deep breaths when prompted.  From the shallow breaths I can hear there is no appreciable wheezing.  Chest x-ray with no pneumonia.  I did offer additional laboratory workup in the  emergency department today and viral URI testing but the patient's family member states that they have been waiting for a long time already today and would not like to pursue any additional testing.  We shared decision making and patient's caregiver states that they will contact her pulmonologist for follow-up and will return to the emergency department if  experiencing any worsening symptoms.  We will trial a Medrol Dosepak and more frequent albuterol usage at home.  Patient discharged with outpatient follow-up and return precautions of which they voiced understanding.   Additional history obtained: -Additional history obtained from wife -External records from outside source obtained and reviewed including: Chart review including previous notes, labs, imaging, consultation notes   Lab Tests: -I ordered, reviewed, and interpreted labs.   The pertinent results include:   Labs Reviewed - No data to display      Imaging Studies ordered: I ordered imaging studies including chest x-ray I independently visualized and interpreted imaging. I agree with the radiologist interpretation   Medicines ordered and prescription drug management: Meds ordered this encounter  Medications   predniSONE (DELTASONE) tablet 60 mg   ipratropium-albuterol (DUONEB) 0.5-2.5 (3) MG/3ML nebulizer solution 3 mL   methylPREDNISolone (MEDROL DOSEPAK) 4 MG TBPK tablet    Sig: Take as prescribed    Dispense:  1 each    Refill:  0    -I have reviewed the patients home medicines and have made adjustments as needed  Critical interventions none    Cardiac Monitoring: The patient was maintained on a cardiac monitor.  I personally viewed and interpreted the cardiac monitored which showed an underlying rhythm of: NSR  Social Determinants of Health:  Factors impacting patients care include: none   Reevaluation: After the interventions noted above, I reevaluated the patient and found that they have :improved  Co  morbidities that complicate the patient evaluation  Past Medical History:  Diagnosis Date   Asthma    Complete heart block (HCC) 06/28/2016   a. s/p MDT His Bundle pacemaker 2018   Diabetes mellitus without complication (HCC)    Hypercholesteremia    Hypertension    Memory loss    Renal disorder       Dispostion: I considered admission for this patient, but he does not meet inpatient criteria for admission at this time and family would like to forego additional testing in favor of outpatient follow-up and return precautions.  This is not unreasonable given stable vital signs and patient discharged     Final Clinical Impression(s) / ED Diagnoses Final diagnoses:  Chronic cough  Uncomplicated asthma, unspecified asthma severity, unspecified whether persistent     @PCDICTATION @    Freyja Govea, , MD 12/06/22 1101

## 2022-12-05 NOTE — ED Triage Notes (Signed)
Productive cough x1 month that is worse laying down.  Went to pcp and suggested mucinex w/o relief. Hx of dementia.

## 2022-12-16 NOTE — Progress Notes (Signed)
Remote pacemaker transmission.   

## 2022-12-18 ENCOUNTER — Other Ambulatory Visit: Payer: Self-pay | Admitting: Internal Medicine

## 2022-12-26 ENCOUNTER — Telehealth: Payer: Self-pay

## 2022-12-26 NOTE — Telephone Encounter (Signed)
Return TC to patients daughter, Fernando [name], daughter states that she feels patient has declined. Daughter shares that patient has had a functional decline since previous PC visit. Patients mobility is poor and he is not walking. Patient is no longer receiving HH therapy. Appetite is the same, no significant weight loss noticed. Patient is pocketing medications and needs a lot of coaching to swallow pills. Patient has had episodes of coughing lately na daughter fears that patient may have PNA. Patient received ABT and over the counter medications for cough, per daughter, however the cough still persists. Patient is sleeping more during the day, but is up at night.   Daughter is open to hospice services for patient at this time.

## 2023-01-01 ENCOUNTER — Other Ambulatory Visit: Payer: Medicare HMO

## 2023-01-10 ENCOUNTER — Encounter: Payer: Self-pay | Admitting: Internal Medicine

## 2023-01-10 NOTE — Telephone Encounter (Signed)
Error

## 2023-01-11 ENCOUNTER — Other Ambulatory Visit: Payer: Self-pay | Admitting: Internal Medicine

## 2023-02-10 ENCOUNTER — Ambulatory Visit: Payer: Medicare HMO

## 2023-02-10 DIAGNOSIS — I442 Atrioventricular block, complete: Secondary | ICD-10-CM

## 2023-02-11 LAB — CUP PACEART REMOTE DEVICE CHECK
Battery Remaining Longevity: 19 mo
Battery Voltage: 2.88 V
Brady Statistic AP VP Percent: 10.44 %
Brady Statistic AP VS Percent: 0 %
Brady Statistic AS VP Percent: 89.2 %
Brady Statistic AS VS Percent: 0.37 %
Brady Statistic RA Percent Paced: 10.61 %
Brady Statistic RV Percent Paced: 99.63 %
Date Time Interrogation Session: 20240212025014
Implantable Lead Connection Status: 753985
Implantable Lead Connection Status: 753985
Implantable Lead Implant Date: 20180207
Implantable Lead Implant Date: 20180207
Implantable Lead Location: 753859
Implantable Lead Location: 753860
Implantable Lead Model: 3830
Implantable Lead Model: 5076
Implantable Pulse Generator Implant Date: 20180207
Lead Channel Impedance Value: 323 Ohm
Lead Channel Impedance Value: 380 Ohm
Lead Channel Impedance Value: 399 Ohm
Lead Channel Impedance Value: 513 Ohm
Lead Channel Pacing Threshold Amplitude: 0.75 V
Lead Channel Pacing Threshold Pulse Width: 0.4 ms
Lead Channel Sensing Intrinsic Amplitude: 4.875 mV
Lead Channel Sensing Intrinsic Amplitude: 4.875 mV
Lead Channel Sensing Intrinsic Amplitude: 6.625 mV
Lead Channel Sensing Intrinsic Amplitude: 6.625 mV
Lead Channel Setting Pacing Amplitude: 2 V
Lead Channel Setting Pacing Amplitude: 2.5 V
Lead Channel Setting Pacing Pulse Width: 1 ms
Lead Channel Setting Sensing Sensitivity: 2 mV
Zone Setting Status: 755011
Zone Setting Status: 755011

## 2023-02-18 ENCOUNTER — Encounter: Payer: Medicare HMO | Admitting: Internal Medicine

## 2023-03-06 ENCOUNTER — Encounter: Payer: Self-pay | Admitting: Internal Medicine

## 2023-03-06 ENCOUNTER — Telehealth: Payer: Self-pay

## 2023-03-06 ENCOUNTER — Ambulatory Visit: Payer: Medicare HMO | Attending: Cardiovascular Disease | Admitting: Internal Medicine

## 2023-03-06 DIAGNOSIS — I442 Atrioventricular block, complete: Secondary | ICD-10-CM

## 2023-03-06 NOTE — Progress Notes (Signed)
Electrophysiology TeleHealth Note      Date:  03/06/2023   ID:  Fernando [name], Fernando [name] [birth year], MRN [id number]  Location: patient's home  Provider location: [address], [place]  Evaluation Performed: Follow-up visit  PCP:  Fernando Hire, MD  Cardiologist:    Electrophysiologist:  SK   Chief Complaint:     History of Present Illness:    Fernando [name] is a 88 y.o. male who presents via audio/video conferencing for a telehealth visit today.  Since last being seen in our clinic for complete heart block with a narrow QRS and His bundle pacing 2/18 with interval of flexion from zoster the patient's daughter reports Now on hospice, eating a little; dementia knows daughter but not most others.    DATE TEST EF    2/17    Myoview   56` % No ischemia exercised only 3'51" min--Max HR 115  2/17  Echo  55-60 % Normal LA and mild RVE and RAE  11/20 Echo  40%      Date Cr K Hgb  8/21 1.43 3.2 14.4  12/21 1.3 3.7 13.8  5/22 1.2 3.4 14.6  1/23 1.1 3.3 13.6       The patient denies symptoms of fevers, chills, cough, or new SOB worrisome for COVID 19.   Past Medical History:  Diagnosis Date   Asthma    Complete heart block ([facility]) 06/28/2016   a. s/p MDT His Bundle pacemaker 2018   Diabetes mellitus without complication ([place])    Hypercholesteremia    Hypertension    Memory loss    Renal disorder     Past Surgical History:  Procedure Laterality Date   Left Arm Surgery     PACEMAKER IMPLANT N/A 02/05/2017   MDT His Bundle dual chamber PPM implanted by Dr Caryl Comes for [facility]     Current Outpatient Medications  Medication Sig Dispense Refill   amLODipine (NORVASC) 10 MG tablet Take 1 tablet by mouth daily.     chlorthalidone (HYGROTON) 25 MG tablet Take by mouth daily. 1/2 tab daily     HYDROcodone-acetaminophen (NORCO/VICODIN) 5-325 MG tablet Take 1 tablet by mouth every 6 (six) hours as needed for severe pain or moderate pain.     ipratropium-albuterol  (DUONEB) 0.5-2.5 (3) MG/3ML SOLN Take 3 mLs by nebulization every 4 (four) hours as needed.     midodrine (PROAMATINE) 2.5 MG tablet TAKE 1 TABLET (2.5 MG) BY MOUTH TWICE DAILY AFTER BREAKFAST AND AFTER LUNCH 180 tablet 0   spironolactone (ALDACTONE) 25 MG tablet Take 0.5 tablets (12.5 mg total) by mouth daily. 45 tablet 0   traZODone (DESYREL) 100 MG tablet Take 100 mg by mouth at bedtime.     clobetasol cream (TEMOVATE) 0.05 % APPLY TO AFFECTED AREA ON LEGS AND KNEES FOR THICK SCALY AND BUMPY AREAS ONCE TO TWICE DAILY AS NEEDED. AVOID, FACE, GROIN AND UNDERARMS (Patient not taking: Reported on 03/06/2023) 60 g 0   clonazePAM (KLONOPIN) 0.5 MG tablet Take 0.25 mg by mouth in the morning and at bedtime. (Patient not taking: Reported on 03/06/2023)     donepezil (ARICEPT) 10 MG tablet Take 10 mg by mouth daily. (Patient not taking: Reported on 03/06/2023)     Fluticasone-Salmeterol (ADVAIR) 250-50 MCG/DOSE AEPB Inhale 1 puff into the lungs 2 (two) times daily. (Patient not taking: Reported on 03/06/2023)     glipiZIDE (GLUCOTROL XL) 10 MG 24 hr tablet Take 10 mg by mouth daily  with breakfast. (Patient not taking: Reported on 03/06/2023)     memantine (NAMENDA) 10 MG tablet Take by mouth. (Patient not taking: Reported on 03/06/2023)     methylPREDNISolone (MEDROL DOSEPAK) 4 MG TBPK tablet Take as prescribed (Patient not taking: Reported on 03/06/2023) 1 each 0   potassium chloride (KLOR-CON) 10 MEQ tablet Take 10 mEq by mouth daily. (Patient not taking: Reported on 03/06/2023)     simvastatin (ZOCOR) 40 MG tablet Take 40 mg by mouth at bedtime. (Patient not taking: Reported on 03/06/2023)     triamcinolone (KENALOG) 0.1 % APPLY TO AFFECTED AREA ON BACK, BUTTOCKS AND ELBOW ONCE TO TWICE DAILY ARE NEEDED. AVOID, FACE, GROIN AND UNDERARMS (Patient not taking: Reported on 03/06/2023) 80 g 2   No current facility-administered medications for this visit.     ROS:  Please see the history of present illness.   All other  systems are personally reviewed and negative.    Exam:    Vital Signs:  There were no vitals taken for this visit.        Labs/Other Tests and Data Reviewed:    Recent Labs: 05/28/2022: BUN 16; Creatinine, Ser 1.01; Hemoglobin 13.6; Platelets 202; Potassium 3.5; Sodium 137   Wt Readings from Last 3 Encounters:  12/05/22 169 lb 12.1 oz (77 kg)  02/19/22 171 lb 2 oz (77.6 kg)  05/15/21 168 lb (76.2 kg)     Other studies personally reviewed: Additional studies/ records that were reviewed today include:   Review of the above records today demonstrates:  Prior radiographs:   Device interrogation from 2/24 demonstrated normal device function battery longevity 1.6 years no interval atrial fibrillation stable trends with 100% ventricular pacing  ASSESSMENT & PLAN:    High-grade heart block 2:1    Pacemaker-His bundle    The patient is now on hospice.  His battery has a year and a half to go.  He will not encroach upon this.  He is currently on ProAmatine at 2.5 mg and spironolactone 12 and half, we will stop both of these.  Hospice will continue to manage his other medications.  Will also discontinue transmissions of his device    Follow-up:  none    Current medicines are reviewed at length with the patient today.   The patient  concerns regarding his medicines.  The following changes were made today:  (As above)   Labs/ tests ordered today include:  No orders of the defined types were placed in this encounter.     Today, I have spent 11 minutes with the patient with telehealth technology discussing the above.  Signed, Virl Axe, MD  03/06/2023 5:01 PM     [facility] [address] [place] [address] [phone] (office)

## 2023-03-06 NOTE — Patient Instructions (Signed)
We will discontinue transmissions of your device.  Medication Instructions:  Your physician has recommended you make the following change in your medication:  1) STOP taking midodrine and spironolactone *If you need a refill on your cardiac medications before your next appointment, please call your pharmacy*  Follow-Up: At [facility], you and your health needs are our priority.  As part of our continuing mission to provide you with exceptional heart care, we have created designated Provider Care Teams.  These Care Teams include your primary Cardiologist (physician) and Advanced Practice Providers (APPs -  Physician Assistants and Nurse Practitioners) who all work together to provide you with the care you need, when you need it.  Your next appointment:   Follow up as needed.

## 2023-03-06 NOTE — Telephone Encounter (Signed)
..   Pt understands that although there may be some limitations with this type of visit, we will take all precautions to reduce any security or privacy concerns.  Pt understands that this will be treated like an in office visit and we will file with pt's insurance, and there may be a patient responsible charge related to this service. ? ?

## 2023-03-25 NOTE — Progress Notes (Signed)
Remote pacemaker transmission.   

## 2023-05-08 ENCOUNTER — Other Ambulatory Visit: Payer: Self-pay | Admitting: Internal Medicine

## 2023-05-08 NOTE — Telephone Encounter (Signed)
Please contact pt for future appointment. Pt overdue for 12 month f/u. 

## 2023-05-12 ENCOUNTER — Ambulatory Visit: Payer: Medicare HMO

## 2023-05-21 NOTE — Telephone Encounter (Signed)
Patient is in hospice , per SK, monitoring is discontinued

## 2023-06-07 ENCOUNTER — Other Ambulatory Visit: Payer: Self-pay | Admitting: Internal Medicine

## 2023-08-11 ENCOUNTER — Ambulatory Visit: Payer: Medicare HMO

## 2023-11-10 ENCOUNTER — Ambulatory Visit: Payer: Medicare HMO

## 2023-11-30 DEATH — deceased

## 2024-02-09 ENCOUNTER — Ambulatory Visit: Payer: Medicare HMO

## 2024-05-10 ENCOUNTER — Ambulatory Visit: Payer: Medicare HMO

## 2024-08-09 ENCOUNTER — Ambulatory Visit: Payer: Medicare HMO

## 2024-11-08 ENCOUNTER — Ambulatory Visit: Payer: Medicare HMO
